# Patient Record
Sex: Female | Born: 1940 | Race: White | Hispanic: No | Marital: Married | Smoking: Never smoker
Health system: Southern US, Community
[De-identification: ages and names within clinical notes are randomized; demographics above are authoritative.]

## PROBLEM LIST (undated history)

## (undated) DIAGNOSIS — I2510 Atherosclerotic heart disease of native coronary artery without angina pectoris: Secondary | ICD-10-CM

## (undated) DIAGNOSIS — N289 Disorder of kidney and ureter, unspecified: Secondary | ICD-10-CM

## (undated) DIAGNOSIS — M549 Dorsalgia, unspecified: Secondary | ICD-10-CM

## (undated) DIAGNOSIS — E785 Hyperlipidemia, unspecified: Secondary | ICD-10-CM

## (undated) DIAGNOSIS — E669 Obesity, unspecified: Secondary | ICD-10-CM

## (undated) DIAGNOSIS — C20 Malignant neoplasm of rectum: Secondary | ICD-10-CM

## (undated) DIAGNOSIS — C50919 Malignant neoplasm of unspecified site of unspecified female breast: Secondary | ICD-10-CM

## (undated) DIAGNOSIS — E119 Type 2 diabetes mellitus without complications: Secondary | ICD-10-CM

## (undated) DIAGNOSIS — I82409 Acute embolism and thrombosis of unspecified deep veins of unspecified lower extremity: Secondary | ICD-10-CM

## (undated) DIAGNOSIS — I10 Essential (primary) hypertension: Secondary | ICD-10-CM

## (undated) DIAGNOSIS — I639 Cerebral infarction, unspecified: Secondary | ICD-10-CM

## (undated) DIAGNOSIS — E039 Hypothyroidism, unspecified: Secondary | ICD-10-CM

## (undated) HISTORY — DX: Malignant neoplasm of rectum: C20

## (undated) HISTORY — PX: ABDOMINAL HYSTERECTOMY: SHX81

## (undated) HISTORY — DX: Hyperlipidemia, unspecified: E78.5

## (undated) HISTORY — DX: Hypothyroidism, unspecified: E03.9

## (undated) HISTORY — DX: Obesity, unspecified: E66.9

## (undated) HISTORY — DX: Type 2 diabetes mellitus without complications: E11.9

## (undated) HISTORY — PX: GALLBLADDER SURGERY: SHX652

## (undated) HISTORY — DX: Essential (primary) hypertension: I10

## (undated) HISTORY — DX: Malignant neoplasm of unspecified site of unspecified female breast: C50.919

## (undated) HISTORY — DX: Dorsalgia, unspecified: M54.9

## (undated) HISTORY — DX: Acute embolism and thrombosis of unspecified deep veins of unspecified lower extremity: I82.409

## (undated) HISTORY — DX: Atherosclerotic heart disease of native coronary artery without angina pectoris: I25.10

## (undated) HISTORY — PX: ANKLE SURGERY: SHX546

## (undated) HISTORY — PX: APPENDECTOMY: SHX54

## (undated) HISTORY — DX: Disorder of kidney and ureter, unspecified: N28.9

## (undated) HISTORY — DX: Cerebral infarction, unspecified: I63.9

## (undated) HISTORY — PX: BREAST LUMPECTOMY: SHX2

---

## 2005-09-27 ENCOUNTER — Encounter: Payer: Self-pay | Admitting: Cardiology

## 2009-06-15 ENCOUNTER — Ambulatory Visit: Payer: Self-pay | Admitting: Family Medicine

## 2009-06-15 ENCOUNTER — Ambulatory Visit: Payer: Self-pay | Admitting: Hematology & Oncology

## 2009-06-15 DIAGNOSIS — I2510 Atherosclerotic heart disease of native coronary artery without angina pectoris: Secondary | ICD-10-CM | POA: Insufficient documentation

## 2009-06-15 DIAGNOSIS — I10 Essential (primary) hypertension: Secondary | ICD-10-CM

## 2009-06-15 DIAGNOSIS — E785 Hyperlipidemia, unspecified: Secondary | ICD-10-CM

## 2009-06-15 DIAGNOSIS — C20 Malignant neoplasm of rectum: Secondary | ICD-10-CM

## 2009-06-15 DIAGNOSIS — E669 Obesity, unspecified: Secondary | ICD-10-CM

## 2009-06-15 DIAGNOSIS — C50919 Malignant neoplasm of unspecified site of unspecified female breast: Secondary | ICD-10-CM | POA: Insufficient documentation

## 2009-07-04 ENCOUNTER — Ambulatory Visit: Payer: Self-pay | Admitting: Diagnostic Radiology

## 2009-07-04 ENCOUNTER — Ambulatory Visit (HOSPITAL_BASED_OUTPATIENT_CLINIC_OR_DEPARTMENT_OTHER): Admission: RE | Admit: 2009-07-04 | Discharge: 2009-07-04 | Payer: Self-pay | Admitting: Internal Medicine

## 2009-07-04 ENCOUNTER — Ambulatory Visit: Payer: Self-pay | Admitting: Family

## 2009-08-08 ENCOUNTER — Ambulatory Visit: Payer: Self-pay | Admitting: Emergency Medicine

## 2009-08-08 DIAGNOSIS — M79609 Pain in unspecified limb: Secondary | ICD-10-CM

## 2009-08-09 ENCOUNTER — Encounter: Admission: RE | Admit: 2009-08-09 | Discharge: 2009-08-09 | Payer: Self-pay | Admitting: Emergency Medicine

## 2009-08-09 ENCOUNTER — Encounter: Payer: Self-pay | Admitting: Emergency Medicine

## 2009-08-15 ENCOUNTER — Ambulatory Visit: Payer: Self-pay | Admitting: Family Medicine

## 2009-08-15 DIAGNOSIS — M170 Bilateral primary osteoarthritis of knee: Secondary | ICD-10-CM

## 2009-08-15 DIAGNOSIS — I803 Phlebitis and thrombophlebitis of lower extremities, unspecified: Secondary | ICD-10-CM

## 2009-08-15 LAB — CONVERTED CEMR LAB: INR: 3.7

## 2009-08-16 ENCOUNTER — Telehealth: Payer: Self-pay | Admitting: Family Medicine

## 2009-08-25 ENCOUNTER — Ambulatory Visit: Payer: Self-pay | Admitting: Family Medicine

## 2009-08-25 ENCOUNTER — Encounter: Admission: RE | Admit: 2009-08-25 | Discharge: 2009-08-25 | Payer: Self-pay | Admitting: Orthopedic Surgery

## 2009-08-25 LAB — CONVERTED CEMR LAB: INR: 2.2

## 2009-09-02 ENCOUNTER — Telehealth (INDEPENDENT_AMBULATORY_CARE_PROVIDER_SITE_OTHER): Payer: Self-pay | Admitting: *Deleted

## 2009-09-08 ENCOUNTER — Ambulatory Visit: Payer: Self-pay | Admitting: Family Medicine

## 2009-09-08 LAB — CONVERTED CEMR LAB: INR: 4.1

## 2009-09-15 ENCOUNTER — Ambulatory Visit: Payer: Self-pay | Admitting: Family Medicine

## 2009-09-15 LAB — HM DIABETES FOOT EXAM

## 2009-09-16 ENCOUNTER — Encounter: Payer: Self-pay | Admitting: Family Medicine

## 2009-09-16 LAB — CONVERTED CEMR LAB
BUN: 38 mg/dL — ABNORMAL HIGH (ref 6–23)
CO2: 25 meq/L (ref 19–32)
Calcium: 8.4 mg/dL (ref 8.4–10.5)
Chloride: 107 meq/L (ref 96–112)
Creatinine, Ser: 1.57 mg/dL — ABNORMAL HIGH (ref 0.40–1.20)
Glucose, Bld: 256 mg/dL — ABNORMAL HIGH (ref 70–99)
Potassium: 5 meq/L (ref 3.5–5.3)
Sodium: 141 meq/L (ref 135–145)
TSH: 5.583 microintl units/mL — ABNORMAL HIGH (ref 0.350–4.500)

## 2009-09-20 ENCOUNTER — Ambulatory Visit: Payer: Self-pay | Admitting: Hematology & Oncology

## 2009-09-21 ENCOUNTER — Encounter: Admission: RE | Admit: 2009-09-21 | Discharge: 2009-09-21 | Payer: Self-pay | Admitting: Family Medicine

## 2009-09-21 LAB — HM MAMMOGRAPHY

## 2009-09-29 ENCOUNTER — Ambulatory Visit: Payer: Self-pay | Admitting: Family Medicine

## 2009-09-29 LAB — CONVERTED CEMR LAB: INR: 1

## 2009-10-12 ENCOUNTER — Encounter: Payer: Self-pay | Admitting: Cardiology

## 2009-10-12 ENCOUNTER — Ambulatory Visit: Payer: Self-pay | Admitting: Cardiology

## 2009-10-12 ENCOUNTER — Inpatient Hospital Stay (HOSPITAL_COMMUNITY): Admission: EM | Admit: 2009-10-12 | Discharge: 2009-10-25 | Payer: Self-pay | Admitting: Cardiology

## 2009-10-13 ENCOUNTER — Telehealth (INDEPENDENT_AMBULATORY_CARE_PROVIDER_SITE_OTHER): Payer: Self-pay | Admitting: *Deleted

## 2009-10-14 ENCOUNTER — Encounter: Payer: Self-pay | Admitting: Cardiology

## 2009-10-20 ENCOUNTER — Encounter: Payer: Self-pay | Admitting: Cardiology

## 2009-10-21 ENCOUNTER — Encounter: Payer: Self-pay | Admitting: Cardiology

## 2009-11-04 ENCOUNTER — Ambulatory Visit: Payer: Self-pay | Admitting: Family Medicine

## 2009-11-04 LAB — CONVERTED CEMR LAB: INR: 2.1

## 2009-11-07 ENCOUNTER — Ambulatory Visit: Payer: Self-pay | Admitting: Family Medicine

## 2009-11-08 LAB — CONVERTED CEMR LAB
BUN: 27 mg/dL — ABNORMAL HIGH (ref 6–23)
CO2: 18 meq/L — ABNORMAL LOW (ref 19–32)
Calcium: 8.9 mg/dL (ref 8.4–10.5)
Chloride: 98 meq/L (ref 96–112)
Creatinine, Ser: 1.62 mg/dL — ABNORMAL HIGH (ref 0.40–1.20)
Glucose, Bld: 583 mg/dL (ref 70–99)
Hgb A1c MFr Bld: 9.5 % — ABNORMAL HIGH (ref <5.7)
Potassium: 5.5 meq/L — ABNORMAL HIGH (ref 3.5–5.3)
Sodium: 131 meq/L — ABNORMAL LOW (ref 135–145)

## 2009-11-09 ENCOUNTER — Encounter: Payer: Self-pay | Admitting: Cardiology

## 2009-11-09 ENCOUNTER — Ambulatory Visit: Payer: Self-pay | Admitting: Cardiology

## 2009-11-10 ENCOUNTER — Telehealth: Payer: Self-pay | Admitting: Family Medicine

## 2009-11-10 ENCOUNTER — Encounter: Payer: Self-pay | Admitting: Cardiology

## 2009-11-10 ENCOUNTER — Encounter: Payer: Self-pay | Admitting: Family Medicine

## 2009-11-22 ENCOUNTER — Ambulatory Visit: Payer: Self-pay | Admitting: Family Medicine

## 2009-11-22 LAB — CONVERTED CEMR LAB: INR: 1.2

## 2009-12-06 ENCOUNTER — Ambulatory Visit: Payer: Self-pay | Admitting: Family Medicine

## 2009-12-06 LAB — CONVERTED CEMR LAB: INR: 6.1

## 2009-12-12 ENCOUNTER — Ambulatory Visit: Payer: Self-pay | Admitting: Family Medicine

## 2009-12-12 ENCOUNTER — Telehealth: Payer: Self-pay | Admitting: Family Medicine

## 2009-12-12 LAB — CONVERTED CEMR LAB: INR: 3.5

## 2009-12-16 ENCOUNTER — Encounter: Payer: Self-pay | Admitting: Family Medicine

## 2009-12-20 ENCOUNTER — Ambulatory Visit: Payer: Self-pay | Admitting: Family Medicine

## 2009-12-20 LAB — CONVERTED CEMR LAB: INR: 1.3

## 2010-01-03 ENCOUNTER — Ambulatory Visit: Payer: Self-pay | Admitting: Family Medicine

## 2010-01-03 LAB — CONVERTED CEMR LAB: INR: 3.7

## 2010-01-19 ENCOUNTER — Ambulatory Visit: Admit: 2010-01-19 | Payer: Self-pay | Admitting: Family Medicine

## 2010-01-25 ENCOUNTER — Ambulatory Visit
Admission: RE | Admit: 2010-01-25 | Discharge: 2010-01-25 | Payer: Self-pay | Source: Home / Self Care | Attending: Cardiology | Admitting: Cardiology

## 2010-01-25 ENCOUNTER — Encounter: Payer: Self-pay | Admitting: Cardiology

## 2010-02-14 NOTE — Letter (Signed)
Summary: [facility]   Imported By: Lanelle Bal 09/29/2009 11:57:51  _____________________________________________________________________  External Attachment:    Type:   Image     Comment:   External Document

## 2010-02-14 NOTE — Assessment & Plan Note (Signed)
Summary: Coumadin check - jr  Nurse Visit   Vitals Entered By: Payton Spark CMA (September 29, 2009 10:15 AM)  Allergies: No Known Drug Allergies Laboratory Results   Blood Tests      INR: 1.0   (Normal Range: 0.88-1.12   Therap INR: 2.0-3.5)    Orders Added: 1)  Fingerstick [36416] 2)  Protime [16109UE]   Anticoagulation Management History:      The patient is on coumadin and comes in today for a routine follow up visit.  She is being anticoagulated because of the first episode of deep venous thrombosis and/or pulmonary embolism.  Anticipated length of treatment is 6 months.  Her last INR was 4.1 and today's INR is 1.0.    Anticoagulation Management Assessment/Plan:      The target INR is 2.0-3.0.  She is to have a PT/INR in 2 weeks.  Anticoagulation instructions were given to patient.         Current Anticoagulation Instructions: The patient's dosage of coumadin will be increased.  The new dosage includes:   Coumadin 5 mg tabs:  Sunday - 5 mg, Monday - 5 mg, Tuesday - 2.5 mg, Wednesday - 5 mg, Thursday - 2.5 mg, Friday - 5 mg, Saturday - 5 mg.    Repeat PT/INR in 2 weeks.

## 2010-02-14 NOTE — Assessment & Plan Note (Signed)
Summary: BAD COLD//VGJ   Vital Signs:  Patient profile:   70 year old female Weight:      193.75 pounds BMI:     35.11 O2 Sat:      97 % on Room air Temp:     98.4 degrees F oral Pulse rate:   72 / minute Pulse rhythm:   regular Resp:     18 per minute BP sitting:   150 / 90  (right arm) Cuff size:   large  Vitals Entered By: Glendell Docker CMA (July 04, 2009 1:55 PM)  O2 Flow:  Room air CC: Rm 5 -URI Comments onset 2 days ago productive cough clear in color, nasal drainage, left earache onset this am, did not check temp at home but felt very warm last night, chest congestion, Humalog per patient on sliding scale, medications reviewed   Primary Care Provider:  Seymour Bars DO  CC:  Rm 5 -URI.  History of Present Illness: [name] is a 70 year old female who presents today with complaint of cough and chest congestion which started two days ago. Cough has been severe at night and patient has found it difficulty to sleep due to excessive coughing. Notes poor energy, and L ear pain.  She has used tylenol without any improvment in her symptoms. She is accompanied today by her daughter.  Allergies: No Known Drug Allergies  Past History:  Past Medical History: Last updated: 07/01/09 rectal cancer- radiation, chemo, surgery 2001 AMI x 3 cardiac stents x 5 HTN DM  High cholesterol hypothyroidism obesity R breast cancer 09  Past Surgical History: Last updated: 2009-07-01 colostomy, revision 04-2009 gall bladder appendix Hysterectomy R lumpectomy- breast cancer, 2009  Family History: Last updated: Jul 01, 2009 M died CHF, 47 F died CHF, COPD at 69 brother, sister DM, CAD  Social History: Last updated: 2009-07-01 Retried from medical billing. Finished HS. Married to [place].  Has 3 kids. Never smoked. Rare ETOH. No regular exericise.  Risk Factors: Alcohol Use: <1 (07/01/2009) Exercise: no (07-01-2009)  Risk Factors: Smoking Status: never  (2009-07-01)  Physical Exam  General:  Well-developed,well-nourished,in no acute distress; alert,appropriate and cooperative throughout examination Head:  Normocephalic and atraumatic without obvious abnormalities. No apparent alopecia or balding. Lungs:  L mid lung crackles, + soft expiratory wheeze, no increased WOB Heart:  Normal rate and regular rhythm. S1 and S2 normal without gallop, murmur, click, rub or other extra sounds. Psych:  Cognition and judgment appear intact. Alert and cooperative with normal attention span and concentration. No apparent delusions, illusions, hallucinations   Impression & Recommendations:  Problem # 1:  BRONCHITIS (ICD-490) Assessment New Reviewed CXR with radiologist.  Neg for pneumonia, + bronchitis.  Clinically suspect some reactive airway as well.  Will avoid PO steroids as patient is diabetic and insulin dependent.  Wheezing is mild at this point.  Will plan for short term flovent/proair and will rx with Zithromax for bronchitis.   Add Tussionex to be given at bedtime as needed for cough.  Pt instructed to follow up in 1 week, sooner if symptoms worsen or do not improve.  Orders: CXR- 2view (CXR) Prescription Created Electronically [id number])  Her updated medication list for this problem includes:    Zithromax Z-pak 250 Mg Tabs (Azithromycin) .Marland Kitchen... 2 tabs by mouth today, then one tablet by mouth daily x 4 more days    Proair Hfa 108 (90 Base) Mcg/act Aers (Albuterol sulfate) .Marland Kitchen... 2 puffs every 6 hours as needed for cough, wheezing  Flovent Diskus 250 Mcg/blist Aepb (Fluticasone propionate (inhal)) ..... One puff twice daily for next 2 weeks    Tussionex Pennkinetic Er 8-10 Mg/34ml Lqcr (Chlorpheniramine-hydrocodone) ..... One teaspoon every 12 hours as needed for severe cough  Complete Medication List: 1)  Prevacid 30 Mg Cpdr (Lansoprazole) .... Take one tabelt by mouth before breakfast 2)  Coreg 12.5 Mg Tabs (Carvedilol) .... Take one tablet by mouth  twice a day with food 3)  Clonidine Hcl 0.3 Mg Tabs (Clonidine hcl) .... Take one tablet by mouth three times a day 4)  Aromasin 25 Mg Tabs (Exemestane) .... Take one tablet by mouth oncea d ay 5)  Zoloft 100 Mg Tabs (Sertraline hcl) .... Take one tablet by mouth once a day 6)  Hydrochlorothiazide 25 Mg Tabs (Hydrochlorothiazide) .... Take one tabelt by mouth once a day 7)  Norvasc 10 Mg Tabs (Amlodipine besylate) .... Take one tablet by mouth once a day 8)  Aspirin 81 Mg Tbec (Aspirin) .... Take one tablet by mouth twice ad ay 9)  Zocor 40 Mg Tabs (Simvastatin) .... Take one tablet by mouth once a day 10)  Lantus For Opticlik 100 Unit/ml Soln (Insulin glargine) .... Inject 25 units in the evening 11)  Vitamin C 500 Mg Chew (Ascorbic acid) .... Take one tablet by mouth once a day 12)  Zithromax Z-pak 250 Mg Tabs (Azithromycin) .... 2 tabs by mouth today, then one tablet by mouth daily x 4 more days 13)  Proair Hfa 108 (90 Base) Mcg/act Aers (Albuterol sulfate) .... 2 puffs every 6 hours as needed for cough, wheezing 14)  Flovent Diskus 250 Mcg/blist Aepb (Fluticasone propionate (inhal)) .... One puff twice daily for next 2 weeks 15)  Tussionex Pennkinetic Er 8-10 Mg/51ml Lqcr (Chlorpheniramine-hydrocodone) .... One teaspoon every 12 hours as needed for severe cough 16)  Humalog 100 Unit/ml Soln (Insulin lispro (human)) .... Sliding scale coverage  Patient Instructions: 1)  Call if symptoms worsen or do not improve. 2)  Follow up in 1 week. Prescriptions: Sandria Senter ER 8-10 MG/5ML LQCR (CHLORPHENIRAMINE-HYDROCODONE) one teaspoon every 12 hours as needed for severe cough  #120 x 0   Entered and Authorized by:   Lemont Fillers FNP   Signed by:   Lemont Fillers FNP on 07/04/2009   Method used:   Print then Give to Patient   RxID:   [id number] PROAIR HFA 108 (90 BASE) MCG/ACT AERS (ALBUTEROL SULFATE) 2 puffs every 6 hours as needed for cough, wheezing  #1 x 0    Entered and Authorized by:   Lemont Fillers FNP   Signed by:   Lemont Fillers FNP on 07/04/2009   Method used:   Electronically to        [facility] [phone]* (retail)       [address] [place], [place]  [address]       Ph: [phone]       Fax: [phone]   RxID:   [id number] ZITHROMAX Z-PAK 250 MG TABS (AZITHROMYCIN) 2 tabs by mouth today, then one tablet by mouth daily x 4 more days  #1 pack x 0   Entered and Authorized by:   Lemont Fillers FNP   Signed by:   Lemont Fillers FNP on 07/04/2009   Method used:   Electronically to        [facility] [phone]* (retail)       [address]  [address] [place]  [address]       Ph: [phone]       Fax: [phone]   RxID:   [id number]

## 2010-02-14 NOTE — Consult Note (Signed)
Summary: [facility]   [facility]   Imported By: Roderic Ovens 11/01/2009 14:45:38  _____________________________________________________________________  External Attachment:    Type:   Image     Comment:   External Document

## 2010-02-14 NOTE — Progress Notes (Signed)
  ROI faxed over to [facility] @ [phone]. [name]  October 13, 2009 1:38 PM      Appended Document:  Records recieved from [facility] gave to [facility]

## 2010-02-14 NOTE — Letter (Signed)
Summary: [name]   [name]   Imported By: Roderic Ovens 11/02/2009 16:27:48  _____________________________________________________________________  External Attachment:    Type:   Image     Comment:   External Document

## 2010-02-14 NOTE — Consult Note (Signed)
Summary: [facility]  [facility]   Imported By: Maryln Gottron 12/23/2009 11:31:29  _____________________________________________________________________  External Attachment:    Type:   Image     Comment:   External Document

## 2010-02-14 NOTE — Assessment & Plan Note (Signed)
Summary: thrombophlebitis   Vital Signs:  Patient profile:   70 year old female Height:      62.4 inches Weight:      196 pounds BMI:     35.52 O2 Sat:      99 % on Room air Pulse rate:   51 / minute BP sitting:   160 / 88  Vitals Entered By: Payton Spark CMA (August 15, 2009 2:04 PM)  O2 Flow:  Room air CC: F/U UC and INR check   Primary Care Provider:  Seymour Bars DO  CC:  F/U UC and INR check.  History of Present Illness: 70 yo WF presents for a superficial thrombophlebitis.  She reports a hx of post op DVT years ago.  This time, she had just returned from a long car ride to [place] when she had pain on the medial and posterior side of her L knee.  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  This pain seemed different.  She went to [facility] and had a LLE venous doppler u/s on 08-09-09 showing no DVT but + fairly extensive superficial thrombophlebitis in the greater saphenous vein (from the mid thigh down to the ankle).  She remains tender.  Able to walk w/o much problem.  Started coumadin 1 wk ago.  Denies any bleeding problems.  Her husband is on coumadin, so she is aware of dietary changes and risks.  Her hx is complicated by a hx of breast and colon cancer.  She is due for her mammogram and is set up to see an oncologist here in Sept after moving here recently from [place].  Her most recent surgery was a colostomy revision in April 2011.    Anticoagulation Management History:      The patient is on coumadin and comes in today for a routine follow up visit.  She is being anticoagulated due to the first episode of deep venous thrombosis and/or pulmonary embolism.  Anticipated length of treatment is 6 months.  Today's INR is 3.7.     Current Medications (verified): 1)  Prevacid 30 Mg Cpdr (Lansoprazole) .... Take One Tabelt By Mouth Before Breakfast 2)  Coreg 12.5 Mg Tabs (Carvedilol) .... Take One Tablet By Mouth Twice A Day With Food 3)  Clonidine Hcl 0.3 Mg  Tabs (Clonidine Hcl) .... Take One Tablet By Mouth Three Times A Day 4)  Aromasin 25 Mg Tabs (Exemestane) .... Take One Tablet By Mouth Oncea D Ay 5)  Zoloft 100 Mg Tabs (Sertraline Hcl) .... Take One Tablet By Mouth Once A Day 6)  Hydrochlorothiazide 25 Mg Tabs (Hydrochlorothiazide) .... Take One Tabelt By Mouth Once A Day 7)  Norvasc 10 Mg Tabs (Amlodipine Besylate) .... Take One Tablet By Mouth Once A Day 8)  Aspirin 81 Mg Tbec (Aspirin) .... Take One Tablet By Mouth Twice Ad Ay 9)  Zocor 40 Mg Tabs (Simvastatin) .... Take One Tablet By Mouth Once A Day 10)  Lantus For Opticlik 100 Unit/ml Soln (Insulin Glargine) .... Inject 25 Units in The Evening 11)  Vitamin C 500 Mg Chew (Ascorbic Acid) .... Take One Tablet By Mouth Once A Day 12)  Proair Hfa 108 (90 Base) Mcg/act Aers (Albuterol Sulfate) .... 2 Puffs Every 6 Hours As Needed For Cough, Wheezing 13)  Flovent Diskus 250 Mcg/blist Aepb (Fluticasone Propionate (Inhal)) .... One Puff Twice Daily For Next 2 Weeks 14)  Humalog 100 Unit/ml Soln (Insulin Lispro (Human)) .Marland KitchenMarland KitchenMarland Kitchen  Sliding Scale Coverage 15)  Freestyle Light Test Strips .... Use Twice Daily As Directed 16)  Warfarin Sodium 5 Mg Tabs (Warfarin Sodium) .Marland Kitchen.. 1 Tab By Mouth Daily  Allergies (verified): No Known Drug Allergies  Past History:  Past Medical History: Reviewed history from 06/15/2009 and no changes required. rectal cancer- radiation, chemo, surgery 2001 AMI x 3 cardiac stents x 5 HTN DM  High cholesterol hypothyroidism obesity R breast cancer 09  Past Surgical History: Reviewed history from 06/15/2009 and no changes required. colostomy, revision 04-2009 gall bladder appendix Hysterectomy R lumpectomy- breast cancer, 2009  Family History: Reviewed history from 06/15/2009 and no changes required. M died CHF, 62 F died CHF, COPD at 50 brother, sister DM, CAD  Social History: Reviewed history from 06/15/2009 and no changes required. Retried from medical  billing. Finished HS. Married to [place].  Has 3 kids. Never smoked. Rare ETOH. No regular exericise.  Review of Systems      See HPI  Physical Exam  General:  obese WF in NAD, here with husband Head:  normocephalic and atraumatic.   Mouth:  pharynx pink and moist.   Neck:  no masses.   Lungs:  normal respiratory effort, no intercostal retractions, and no accessory muscle use.   Heart:  Normal rate and regular rhythm. S1 and S2 normal without gallop, murmur, click, rub or other extra sounds. Msk:  bilat knee crepitus.  no joint effusions, normal gait Pulses:  2+ pedal pulses Extremities:  L posterior and medial knee very tender to touch.  L posterior thigh and L calf tender to touch with palpable cord.  No redness or heat Skin:  color normal.   Cervical Nodes:  No lymphadenopathy noted Psych:  good eye contact, not anxious appearing, and not depressed appearing.     Impression & Recommendations:  Problem # 1:  PHLEBITIS&THROMBOPHLEBITIS LOWER EXTREM UNSPEC (ICD-451.2) Assessment New Reviewed the results of her u/s from 7-26.  This greater saphenoous thrombophlebitis has the potential to extend into the deep veins, so will be treated like a DVT.  Continue coumadin.  Start graduated compression hose for comfort and swelling.  This was likely caused by her recent long car trip but she has hx of a post op DVT in the past and has had both colon and breast cancer.  I've set her up with oncology and printed off an order to update her mammogram.  Colonsocpy was recently doen prior to her colon resection per per report.   The following medications were removed from the medication list:    Warfarin Sodium 5 Mg Tabs (Warfarin sodium) .Marland Kitchen... 1 tab by mouth daily Her updated medication list for this problem includes:    Aspirin 81 Mg Tbec (Aspirin) .Marland Kitchen... Take one tablet by mouth twice ad ay    Coumadin 5 Mg Tabs (Warfarin sodium) ..... Sunday - 5 mg, monday - 2.5 mg, tuesday - 5 mg, wednesday - 2.5  mg, thursday - 5 mg, friday - 2.5 mg, saturday - 2.5 mg  Orders: Fingerstick (16109) Protime INR (60454)  Problem # 2:  COUMADIN THERAPY (ICD-V58.61) Adjusted her coumadin DOWN today.  REcheck in 2 wks.  Problem # 3:  ESSENTIAL HYPERTENSION, BENIGN (ICD-401.1)  BP remains high despite multiple anti hypertensives.  Not sure why she is not on an ACEi or ARB.  She does not recall any allergies to these medications.  Will update a BMP and if Cr is normal, will start.  O/W given her hx of CAD  and HTN--> refer to cards. Her updated medication list for this problem includes:    Coreg 12.5 Mg Tabs (Carvedilol) .Marland Kitchen... Take one tablet by mouth twice a day with food    Clonidine Hcl 0.3 Mg Tabs (Clonidine hcl) .Marland Kitchen... Take one tablet by mouth three times a day    Hydrochlorothiazide 25 Mg Tabs (Hydrochlorothiazide) .Marland Kitchen... Take one tabelt by mouth once a day    Norvasc 10 Mg Tabs (Amlodipine besylate) .Marland Kitchen... Take one tablet by mouth once a day  BP today: 160/88 Prior BP: 162/70 (08/08/2009)  Orders: T-Basic Metabolic Panel [phone])  Problem # 4:  OSTEOARTHRITIS, KNEE, LEFT (ICD-715.96) Ongoing problem.  Had synvisc injections in [place] and reports that they did not help. She is set up to see ortho.  Maybe a steroid injection will help alleviate her pain.  Avoid NSAIDs with use of coumadin.  Wt loss would definitely be benefitical. Her updated medication list for this problem includes:    Aspirin 81 Mg Tbec (Aspirin) .Marland Kitchen... Take one tablet by mouth twice ad ay  Complete Medication List: 1)  Prevacid 30 Mg Cpdr (Lansoprazole) .... Take one tabelt by mouth before breakfast 2)  Coreg 12.5 Mg Tabs (Carvedilol) .... Take one tablet by mouth twice a day with food 3)  Clonidine Hcl 0.3 Mg Tabs (Clonidine hcl) .... Take one tablet by mouth three times a day 4)  Aromasin 25 Mg Tabs (Exemestane) .... Take one tablet by mouth oncea d ay 5)  Zoloft 100 Mg Tabs (Sertraline hcl) .... Take one tablet by mouth once  a day 6)  Hydrochlorothiazide 25 Mg Tabs (Hydrochlorothiazide) .... Take one tabelt by mouth once a day 7)  Norvasc 10 Mg Tabs (Amlodipine besylate) .... Take one tablet by mouth once a day 8)  Aspirin 81 Mg Tbec (Aspirin) .... Take one tablet by mouth twice ad ay 9)  Zocor 40 Mg Tabs (Simvastatin) .... Take one tablet by mouth once a day 10)  Lantus For Opticlik 100 Unit/ml Soln (Insulin glargine) .... Inject 25 units in the evening 11)  Vitamin C 500 Mg Chew (Ascorbic acid) .... Take one tablet by mouth once a day 12)  Proair Hfa 108 (90 Base) Mcg/act Aers (Albuterol sulfate) .... 2 puffs every 6 hours as needed for cough, wheezing 13)  Flovent Diskus 250 Mcg/blist Aepb (Fluticasone propionate (inhal)) .... One puff twice daily for next 2 weeks 14)  Humalog 100 Unit/ml Soln (Insulin lispro (human)) .... Sliding scale coverage 15)  Freestyle Light Test Strips  .... Use twice daily as directed 16)  Coumadin 5 Mg Tabs (Warfarin sodium) .... Sunday - 5 mg, monday - 2.5 mg, tuesday - 5 mg, wednesday - 2.5 mg, thursday - 5 mg, friday - 2.5 mg, saturday - 2.5 mg 17)  Graduated Compression Pantyhose  .... Dx: thrombophlebitis les  Other Orders: T-Mammography, Diagnostic (bilateral) (84132)  Anticoagulation Management Assessment/Plan:      The target INR is 2.0-3.0.  She is to have a PT/INR in 1 week.  Anticoagulation instructions were given to patient.         Current Anticoagulation Instructions: Hold dose Today.  The patient's dosage of coumadin will be decreased.  The new dosage includes: Coumadin 5 mg tabs:  Sunday - 5 mg, Monday - 2.5 mg, Tuesday - 5 mg, Wednesday - 2.5 mg, Thursday - 5 mg, Friday - 2.5 mg, Saturday - 2.5 mg.   Repeat PT/INR in 1 week.    Patient Instructions: 1)  See changes  to coumadin dose. 2)  Use Tylenol Extra Strength for pain. 3)  Wear graduated compression hose and elevate legs. 4)  Warm moist heat may help your pain. 5)  Recheck INR in 10 days. 6)  Will get  you into cardiology for BP -- high even on mutliple meds. 7)  You should be seeing oncology next month. 8)  Will update your mammogram. Prescriptions: GRADUATED COMPRESSION PANTYHOSE dx: thrombophlebitis LEs  #2 pair x 0   Entered and Authorized by:   Seymour Bars DO   Signed by:   Seymour Bars DO on 08/15/2009   Method used:   Printed then faxed to ...       [address] [phone]* (retail)       [address] [place], [place]  [address]       Ph: [phone]       Fax: [phone]   RxID:   [id number]   Laboratory Results   Blood Tests      INR: 3.7   (Normal Range: 0.88-1.12   Therap INR: 2.0-3.5)

## 2010-02-14 NOTE — Progress Notes (Signed)
Summary: Faxed lab results to [facility]  Phone Note From Other Clinic   Caller: Nurse Summary of Call: Faxed 11/04/09 Blood test results to [facility] [phone] attn: Diane Initial call taken by: Lannette Donath,  November 10, 2009 3:34 PM

## 2010-02-14 NOTE — Assessment & Plan Note (Signed)
Summary: 1 mo. f/u on Diabetes- jr   Vital Signs:  Patient profile:   70 year old female Height:      62.4 inches Weight:      196 pounds BMI:     35.52 O2 Sat:      96 % on Room air Pulse rate:   55 / minute BP sitting:   160 / 78  (left arm) Cuff size:   large  Vitals Entered By: Payton Spark CMA (September 15, 2009 10:24 AM)  O2 Flow:  Room air CC: F/U INR and DM   Primary Care Provider:  Seymour Bars DO  CC:  F/U INR and DM.  History of Present Illness: [name] is a 70 year-old female with a history of DM, HTN, hyperlipidemia and DVT.  Today she reports she is doing well and that she and her husband are getting ready to move into a house.  In the morning her blood glucose readings are usually around 150 to 160 and her readings in the evenings are around 200 or higher.  She claims this because she is not on a regular eating schedule and she is eating out at restuarants because most of her time is spent packing and preparing for their move.  In addition, she also skipping meals and having readings that are in the 80s to 90s and becomes lightheaded and has nausea.  She reports severe nocturia last night with getting up 5 or 6 times last night to go to the bathroom but denies any polydipsia or polyuria during the day.  She also denies any hematuria, burning sensations while going, straining to go or dysuria.  In addition, she is not expericing palpitations, abdominal pain, shortness of breath any muscle aches and pains, vomitting or vision changes.    Anticoagulation Management History:      The patient is on coumadin and comes in today for a routine follow up visit.  She is being anticoagulated due to the first episode of deep venous thrombosis and/or pulmonary embolism.  Anticipated length of treatment is 6 months.  Her last INR was 4.1.    Current Medications (verified): 1)  Prevacid 30 Mg Cpdr (Lansoprazole) .... Take One Tabelt By Mouth Before Breakfast 2)  Coreg 12.5 Mg  Tabs (Carvedilol) .... Take One Tablet By Mouth Twice A Day With Food 3)  Clonidine Hcl 0.3 Mg Tabs (Clonidine Hcl) .... Take One Tablet By Mouth Three Times A Day 4)  Aromasin 25 Mg Tabs (Exemestane) .... Take One Tablet By Mouth Oncea D Ay 5)  Zoloft 100 Mg Tabs (Sertraline Hcl) .... Take One Tablet By Mouth Once A Day 6)  Hydrochlorothiazide 25 Mg Tabs (Hydrochlorothiazide) .... Take One Tabelt By Mouth Once A Day 7)  Norvasc 10 Mg Tabs (Amlodipine Besylate) .... Take One Tablet By Mouth Once A Day 8)  Aspirin 81 Mg Tbec (Aspirin) .... Take One Tablet By Mouth Twice Ad Ay 9)  Zocor 40 Mg Tabs (Simvastatin) .... Take One Tablet By Mouth Once A Day 10)  Lantus Solostar 100 Unit/ml Soln (Insulin Glargine) .... Inj 25 Units Every Evening 11)  Vitamin C 500 Mg Chew (Ascorbic Acid) .... Take One Tablet By Mouth Once A Day 12)  Freestyle Light Test Strips .... Use Twice Daily As Directed 13)  Humalog Pen 100 Unit/ml Soln (Insulin Lispro (Human)) .... Sliding Scale As Directed 14)  Coumadin 5 Mg Tabs (Warfarin Sodium) .... Sunday - 5 Mg, Monday - 2.5 Mg, Tuesday -  2.5 Mg, Wednesday - 5 Mg, Thursday - 2.5 Mg, Friday - 2.5 Mg, Saturday - 2.5 Mg  Allergies (verified): No Known Drug Allergies  Past History:  Past Medical History: rectal cancer- radiation, chemo, surgery 2001 AMI x 3 cardiac stents x 5 HTN DM  High cholesterol hypothyroidism obesity R breast cancer 09 DVT 08-2009  Past Surgical History: Reviewed history from 06/15/2009 and no changes required. colostomy, revision 04-2009 gall bladder appendix Hysterectomy R lumpectomy- breast cancer, 2009  Social History: Reviewed history from 06/15/2009 and no changes required. Retried from medical billing. Finished HS. Married to [place].  Has 3 kids. Never smoked. Rare ETOH. No regular exericise.  Review of Systems      See HPI  Physical Exam  General:  alert, well-developed, and well-nourished.  obese Eyes:  pupils equal,  pupils round, and pupils reactive to light.   Mouth:  good dentition.   Neck:  supple and no thyromegaly.  No bruits appreciated Lungs:  Clear to auscultation bilaterally with no wheezes, rales, rhonchi or crackles.  Heart:  normal rate, regular rhythm, no murmur, no gallop, and no rub.   Abdomen:  soft and non-tender.  Dimished bowel sounds Pulses:  R popliteal normal, R posterior tibial normal, R dorsalis pedis normal, L popliteal normal, L posterior tibial normal, and L dorsalis pedis normal.   Extremities:  1+ left pedal edema.   Skin:  color normal.   Psych:  good eye contact and not anxious appearing.    Diabetes Management Exam:    Foot Exam (with socks and/or shoes not present):       Sensory-Pinprick/Light touch:          Left medial foot (L-4): normal          Left dorsal foot (L-5): normal          Left lateral foot (S-1): normal          Right medial foot (L-4): normal          Right dorsal foot (L-5): normal          Right lateral foot (S-1): normal       Sensory-Monofilament:          Left foot: normal          Right foot: normal       Sensory-other: Decreased vibratory sense at the ankle but normal at the great toe       Inspection:          Left foot: normal          Right foot: normal   Impression & Recommendations:  Problem # 1:  PHLEBITIS&THROMBOPHLEBITIS LOWER EXTREM UNSPEC (ICD-451.2) She has not been regularly taking her coumadin due to 'being busy'.  I reminded her to take it every evening as directed since we are treating her for a DVT.  She is to stay on the current dose and recheck in 2 wks. The following medications were removed from the medication list:    Coumadin 5 Mg Tabs (Warfarin sodium) ..... Sunday - 5 mg, monday - 2.5 mg, tuesday - 5 mg, wednesday - 2.5 mg, thursday - 5 mg, friday - 2.5 mg, saturday - 2.5 mg    Coumadin 5 Mg Tabs (Warfarin sodium) ..... Sunday - 5 mg, monday - 2.5 mg, tuesday - 2.5 mg, wednesday - 5 mg, thursday - 2.5 mg, friday  - 2.5 mg, saturday - 2.5 mg Her updated medication list for this problem includes:  Aspirin 81 Mg Tbec (Aspirin) .Marland Kitchen... Take one tablet by mouth twice ad ay    Coumadin 5 Mg Tabs (Warfarin sodium) ..... Sunday - 5 mg, monday - 2.5 mg, tuesday - 2.5 mg, wednesday - 5 mg, thursday - 2.5 mg, friday - 2.5 mg, saturday - 2.5 mg  Orders: Protime ([id number])  Problem # 2:  IDDM (ICD-250.01) Continue current meds.  Poor control with A1C of 8 likely due to med non adherence, poor diet and lack of exercise.  Check labs and if renal function OK, add Kombiglyze. Her updated medication list for this problem includes:    Aspirin 81 Mg Tbec (Aspirin) .Marland Kitchen... Take one tablet by mouth twice ad ay    Lantus Solostar 100 Unit/ml Soln (Insulin glargine) ..... Inj 25 units every evening    Humalog Pen 100 Unit/ml Soln (Insulin lispro (human)) ..... Sliding scale as directed  Orders: Fingerstick (36416) Hgb A1C ([id number]) T-Basic Metabolic Panel ([id number])  Problem # 3:  ESSENTIAL HYPERTENSION, BENIGN (ICD-401.1) BP still high.  Check BMP today and if Cr and K+ are OK, will add ACEi. Her updated medication list for this problem includes:    Coreg 12.5 Mg Tabs (Carvedilol) .Marland Kitchen... Take one tablet by mouth twice a day with food    Clonidine Hcl 0.3 Mg Tabs (Clonidine hcl) .Marland Kitchen... Take one tablet by mouth three times a day    Hydrochlorothiazide 25 Mg Tabs (Hydrochlorothiazide) .Marland Kitchen... Take one tabelt by mouth once a day    Norvasc 10 Mg Tabs (Amlodipine besylate) .Marland Kitchen... Take one tablet by mouth once a day  BP today: 160/78 Prior BP: 160/88 (08/15/2009)  Problem # 4:  HYPERLIPIDEMIA (ICD-272.4)  Her updated medication list for this problem includes:    Zocor 40 Mg Tabs (Simvastatin) .Marland Kitchen... Take one tablet by mouth once a day  Complete Medication List: 1)  Prevacid 30 Mg Cpdr (Lansoprazole) .... Take one tabelt by mouth before breakfast 2)  Coreg 12.5 Mg Tabs (Carvedilol) .... Take one tablet by mouth twice a day  with food 3)  Clonidine Hcl 0.3 Mg Tabs (Clonidine hcl) .... Take one tablet by mouth three times a day 4)  Aromasin 25 Mg Tabs (Exemestane) .... Take one tablet by mouth oncea d ay 5)  Zoloft 100 Mg Tabs (Sertraline hcl) .... Take one tablet by mouth once a day 6)  Hydrochlorothiazide 25 Mg Tabs (Hydrochlorothiazide) .... Take one tabelt by mouth once a day 7)  Norvasc 10 Mg Tabs (Amlodipine besylate) .... Take one tablet by mouth once a day 8)  Aspirin 81 Mg Tbec (Aspirin) .... Take one tablet by mouth twice ad ay 9)  Zocor 40 Mg Tabs (Simvastatin) .... Take one tablet by mouth once a day 10)  Lantus Solostar 100 Unit/ml Soln (Insulin glargine) .... Inj 25 units every evening 11)  Vitamin C 500 Mg Chew (Ascorbic acid) .... Take one tablet by mouth once a day 12)  Freestyle Light Test Strips  .... Use twice daily as directed 13)  Humalog Pen 100 Unit/ml Soln (Insulin lispro (human)) .... Sliding scale as directed 14)  Coumadin 5 Mg Tabs (Warfarin sodium) .... Sunday - 5 mg, monday - 2.5 mg, tuesday - 2.5 mg, wednesday - 5 mg, thursday - 2.5 mg, friday - 2.5 mg, saturday - 2.5 mg  Other Orders: T-TSH ([id number])  Anticoagulation Management Assessment/Plan:      The target INR is 2.0-3.0.  She is to have a PT/INR in 2 weeks.  Anticoagulation instructions were given to  patient.         Current Anticoagulation Instructions:   Coumadin 5 mg tabs:  Sunday - 5 mg, Monday - 2.5 mg, Tuesday - 2.5 mg, Wednesday - 5 mg, Thursday - 2.5 mg, Friday - 2.5 mg, Saturday - 2.5 mg.    The patient is to continue with the same dose of coumadin.  This dosage includes: Repeat PT/INR in 2 weeks.    Patient Instructions: 1)  Labs today. 2)  Will call you w/ results tomorrow- may add a new agent for BP and diabetes. 3)  Work on medication compliance, diabetic diet and exercise. 4)  AM fasting sugar goal is 80-110 5)  2 hrs after dinner goal is <150. 6)  Recheck INR 2 wks. 7)  Return for f/u BP/ DM in 6  wks.

## 2010-02-14 NOTE — Assessment & Plan Note (Signed)
Summary: Coumadin Check - jr  Nurse Visit   Vitals Entered By: Payton Spark CMA (December 06, 2009 1:09 PM)  Allergies: No Known Drug Allergies Laboratory Results   Blood Tests      INR: 6.1   (Normal Range: 0.88-1.12   Therap INR: 2.0-3.5)    Orders Added: 1)  Fingerstick [36416] 2)  Protime [[id number]]   Anticoagulation Management History:      The patient is on coumadin and comes in today for a routine follow up visit.  Coumadin therapy is being given due to the first episode of deep venous thrombosis and/or pulmonary embolism.  Anticipated length of treatment is 6 months.  Her last INR was 1.2 and today's INR is 6.1.    Anticoagulation Management Assessment/Plan:      The target INR is 2.0-3.0.  She is to have a PT/INR in 2 weeks.  Anticoagulation instructions were given to patient.         Current Anticoagulation Instructions: The patient's dosage of coumadin will be decreased.  The new dosage includes: Coumadin 5 mg tabs:  Sunday - 5 mg, Monday - 2.5 mg, Tuesday - 5 mg, Wednesday - 2.5 mg, Thursday - 5 mg, Friday - 2.5 mg, Saturday - 5 mg.    recheck 10 daysRepeat PT/INR in 2 weeks.

## 2010-02-14 NOTE — Letter (Signed)
Summary: [facility] Patient Note   [facility] Patient Note   Imported By: Roderic Ovens 12/01/2009 16:57:01  _____________________________________________________________________  External Attachment:    Type:   Image     Comment:   External Document

## 2010-02-14 NOTE — Assessment & Plan Note (Signed)
Summary: PAIN IN LEFT LEG & KNEE/KH   Vital Signs:  Patient Profile:   70 Years Old Female CC:      sharp pain in left leg/knee X 3 days Height:     62.4 inches Weight:      200 pounds O2 Sat:      99 % O2 treatment:    Room Air Temp:     97.0 degrees F oral Pulse rate:   56 / minute Resp:     12 per minute BP sitting:   162 / 70  (left arm) Cuff size:   large  Pt. in pain?   yes    Location:   left leg/knee    Intensity:   8    Type:       sharp  Vitals Entered By: Lajean Saver RN (August 08, 2009 3:47 PM)                   Updated Prior Medication List: PREVACID 30 MG CPDR (LANSOPRAZOLE) Take one tabelt by mouth before breakfast COREG 12.5 MG TABS (CARVEDILOL) Take one tablet by mouth twice a day with food CLONIDINE HCL 0.3 MG TABS (CLONIDINE HCL) take one tablet by mouth three times a day AROMASIN 25 MG TABS (EXEMESTANE) take one tablet by mouth oncea d ay ZOLOFT 100 MG TABS (SERTRALINE HCL) Take one tablet by mouth once a day HYDROCHLOROTHIAZIDE 25 MG TABS (HYDROCHLOROTHIAZIDE) Take one tabelt by mouth once a day NORVASC 10 MG TABS (AMLODIPINE BESYLATE) take one tablet by mouth once a day ASPIRIN 81 MG TBEC (ASPIRIN) Take one tablet by mouth twice ad ay ZOCOR 40 MG TABS (SIMVASTATIN) Take one tablet by mouth once a day LANTUS FOR OPTICLIK 100 UNIT/ML SOLN (INSULIN GLARGINE) Inject 25 units in the evening VITAMIN C 500 MG CHEW (ASCORBIC ACID) Take one tablet by mouth once a day PROAIR HFA 108 (90 BASE) MCG/ACT AERS (ALBUTEROL SULFATE) 2 puffs every 6 hours as needed for cough, wheezing FLOVENT DISKUS 250 MCG/BLIST AEPB (FLUTICASONE PROPIONATE (INHAL)) one puff twice daily for next 2 weeks HUMALOG 100 UNIT/ML SOLN (INSULIN LISPRO (HUMAN)) sliding scale coverage * FREESTYLE LIGHT TEST STRIPS use twice daily as directed  Current Allergies (reviewed today): No known allergies History of Present Illness Chief Complaint: sharp pain in left leg/knee X 3 days History of  Present Illness: Left leg and knee pain for the last 3 days.  She has a history of Colon surgery in the past with a chronically swollen leg from her hip all the way to her foot.  No recent redness or increased swelling or warmth.  But she has noticed left knee pain, especially medial & posterior aspect with some radiation distally on medial aspect.  Taking Tylenol and ASA which help a little.  Elevation helps.  She has a pneumatic compression device but since she is moving doesn't use it very much.  She also cannot recall if she has recently hurt her knee or leg by twisting or falling, but says it is possible since there are a lot of kids around.  No fever, chills. No CP, SOB.  REVIEW OF SYSTEMS Constitutional Symptoms      Denies fever, chills, night sweats, weight loss, weight gain, and fatigue.  Eyes       Denies change in vision, eye pain, eye discharge, glasses, contact lenses, and eye surgery. Ear/Nose/Throat/Mouth       Denies hearing loss/aids, change in hearing, ear pain, ear discharge, dizziness, frequent runny  nose, frequent nose bleeds, sinus problems, sore throat, hoarseness, and tooth pain or bleeding.  Respiratory       Denies dry cough, productive cough, wheezing, shortness of breath, asthma, bronchitis, and emphysema/COPD.  Cardiovascular       Denies murmurs, chest pain, and tires easily with exhertion.    Gastrointestinal       Denies stomach pain, nausea/vomiting, diarrhea, constipation, blood in bowel movements, and indigestion. Genitourniary       Denies painful urination, kidney stones, and loss of urinary control. Neurological       Denies paralysis, seizures, and fainting/blackouts. Musculoskeletal       Complains of muscle pain, joint pain, and joint stiffness.      Denies decreased range of motion, redness, swelling, muscle weakness, and gout.      Comments: left leg/knee, edema to left ankle- normal Skin       Denies bruising, unusual mles/lumps or sores, and  hair/skin or nail changes.  Psych       Denies mood changes, temper/anger issues, anxiety/stress, speech problems, depression, and sleep problems. Other Comments: patient c/o of sharp pain to left knee/leg staring about 3 days ago gradually increasing. She says she has had phlebitis before and had simillar symptoms   Past History:  Past Medical History: Reviewed history from 06/15/2009 and no changes required. rectal cancer- radiation, chemo, surgery 2001 AMI x 3 cardiac stents x 5 HTN DM  High cholesterol hypothyroidism obesity R breast cancer 09  Past Surgical History: Reviewed history from 06/15/2009 and no changes required. colostomy, revision 04-2009 gall bladder appendix Hysterectomy R lumpectomy- breast cancer, 2009  Family History: Reviewed history from 06/15/2009 and no changes required. M died CHF, 53 F died CHF, COPD at 29 brother, sister DM, CAD  Social History: Reviewed history from 06/15/2009 and no changes required. 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  Has 3 kids. Never smoked. Rare ETOH. No regular exericise. Physical Exam General appearance: well developed, well nourished, no acute distress Chest/Lungs: no rales, wheezes, or rhonchi bilateral, breath sounds equal without effort Heart: regular rate and  rhythm, no murmur Left knee: ROM is limited due to pain, TTP medial joint line, lateral joint line, and posteromedial knee.  Old surgical scars present. Left leg: swelling from groin to toes, non-pitting, neg Homan's, no warmth, TTP mostly medial aspect minimal on posterior and some anteriorly as well, varicose veins are present Assessment New Problems: LEG PAIN, LEFT (ICD-729.5)  This is most likely knee pain, probably medial meniscal tear from the exam.  I do not believe this is a DVT as she has no increased swelling (from baseline) or other signs, however I will order an U/S to rule out this condition as it is the most  dangerous.  Plan New Orders: New Patient Level III [99203] Lower Extremity Ultrasound with Doppler [Sono LE w/Doppler] Planning Comments:   Continue your daily aspirin We will call you with the U/S results.  If positive, need to go to ER or call your PCP to set up treatment. If negative, need to follow up with orthopedics to discuss your knee pain Compression stockings if test neg, elevation, ice, Tylenol for pain If any CP, SOB, new symptoms, call EMS or go to the ER   The patient and/or caregiver has been counseled thoroughly with regard to medications prescribed including dosage, schedule, interactions, rationale for use, and possible side effects and they verbalize understanding.  Diagnoses and expected course of recovery discussed and  will return if not improved as expected or if the condition worsens. Patient and/or caregiver verbalized understanding.   Orders Added: 1)  New Patient Level III [99203] 2)  Lower Extremity Ultrasound with Doppler [Sono LE w/Doppler]  Appended Document: PAIN IN LEFT LEG & KNEE/KH Ultrasound shows extensive superficial thrombophlebitis of greater saphenous vein, no DVT.  However, with this possible potential of this particular vein to progress to DVT, I spoke to her PCP who wishes to start on Coumadin 5mg  Daily (no bridging Lovanox due to this not being a DVT) and f/u on Monday for INR check. I have educated her an additional 20 mins on the diagnosis and ER precautions. Her husband is also on Coumadin so they understand and will also see PCP on Thursday for his appt.  I will also refer to orthopedics to discuss her knee pain.  Her knee pain is fairly chronic, and with the confusing picture of possible phlebitis on top of it, I think it is reasonable to have her talk to ortho as well.   We will schedule her today for that appointment.

## 2010-02-14 NOTE — Assessment & Plan Note (Signed)
Summary: [name]   Visit Type:  Follow-up Primary Provider:  Seymour Bars DO  CC:  Chest discomfort- Sob.  History of Present Illness: 70 year old female for evaluation of CAD. Patient has had previous stents placed x5. 4 were placed in [place] and one in [place]. The last one was approximately 2 years ago. I have none of those records available. The patient states she has had chest pain for years. She is having substernal chest pain that occasionally radiates to her left shoulder. It is described as a heaviness. There is shortness of breath but no nausea, vomiting or diaphoresis. It is not pleuritic, positional, related to food or exertion. It lasts 2 hours and resolves unchanged. Having a stent placed apparently did not change her pain. She also has dyspnea on exertion. There is no orthopnea or PND but she has had edema in the left lower extremity and a DVT was diagnosed in August. She is on Coumadin. Because of the above we were asked to further evaluate. Note her last episode of chest pain was approximately 5 days ago. She has these episodes approximately 2 times weekly. She did state that her previous cardiologist was aware of these. She also had followup stress test that apparently were unremarkable. We do not have those records available. Note patient's SBP initially 240 in the office and patient complained of headache.  Current Medications (verified): 1)  Prevacid 30 Mg Cpdr (Lansoprazole) .... Take One Tabelt By Mouth Before Breakfast 2)  Coreg 25 Mg Tabs (Carvedilol) .Marland Kitchen.. 1 Tab By Mouth Bid 3)  Clonidine Hcl 0.3 Mg Tabs (Clonidine Hcl) .Marland Kitchen.. 1 Tab By Mouth Tid 4)  Aromasin 25 Mg Tabs (Exemestane) .... Take One Tablet By Mouth Oncea D Ay 5)  Zoloft 100 Mg Tabs (Sertraline Hcl) .... Take One Tablet By Mouth Once A Day 6)  Hydrochlorothiazide 25 Mg Tabs (Hydrochlorothiazide) .... 1/2 Tab By Mouth Daily 7)  Norvasc 10 Mg Tabs (Amlodipine Besylate) .... Take One Tablet By  Mouth Once A Day 8)  Aspirin 81 Mg Tbec (Aspirin) .... Take One Tablet By Mouth Twice Ad Ay 9)  Zocor 40 Mg Tabs (Simvastatin) .... Take One Tablet By Mouth Once A Day 10)  Lantus Solostar 100 Unit/ml Soln (Insulin Glargine) .... Inj 25 Units Every Evening 11)  Freestyle Light Test Strips .... Use Twice Daily As Directed 12)  Humalog Pen 100 Unit/ml Soln (Insulin Lispro (Human)) .... Sliding Scale As Directed 13)  Synthroid 25 Mcg Tabs (Levothyroxine Sodium) .... Take 1 Tablet By Mouth Once A Day 14)  Coumadin 5 Mg Tabs (Warfarin Sodium) .... Sunday - 5 Mg, Monday - 5 Mg, Tuesday - 2.5 Mg, Wednesday - 5 Mg, Thursday - 2.5 Mg, Friday - 5 Mg, Saturday - 5 Mg  Allergies (verified): No Known Drug Allergies  Past History:  Past Medical History: rectal cancer- radiation, chemo, surgery 2001 AMI x 3 cardiac stents x 5 ([place] and [place]) HTN DM  Hyperlipidemia hypothyroidism obesity R breast cancer 09 DVT 08-2009 Chronic back pain  Past Surgical History: colostomy, revision 04-2009 gall bladder appendix Hysterectomy R lumpectomy- breast cancer, 2009 Previous surgery for ankle fracture  Family History: Reviewed history from 10/12/2009 and no changes required. M died CHF, 62 F died CHF, COPD at 65 brother, sister DM, CAD  Social History: Reviewed history from 10/12/2009 and no changes required. Retried from medical billing. Finished HS. Married to [place].  Has 3 kids. Never smoked. Rare ETOH. No regular exericise.  Review of  Systems       Chronic back pain and also headaches but no fevers or chills, productive cough, hemoptysis, dysphasia, odynophagia, melena, hematochezia, dysuria, hematuria, rash, seizure activity, orthopnea, PND,  claudication. Remaining systems are negative.   Vital Signs:  Patient profile:   70 year old female Height:      62.4 inches Weight:      193.75 pounds BMI:     35.11 Pulse rate:   63 / minute Pulse rhythm:   regular Resp:      18 per minute BP sitting:   240 / 90  (left arm) Cuff size:   large  Vitals Entered By: Vikki Ports (October 12, 2009 3:38 PM)  Physical Exam  General:  Well developed/obese in NAD Skin warm/dry Patient not depressed No peripheral clubbing Back-normal HEENT-normal/normal eyelids; No papilledema. Neck supple/normal carotid upstroke bilaterally; no bruits; no JVD; no thyromegaly chest - CTA/ normal expansion CV - RRR/normal S1 and S2; no  rubs or gallops;  PMI nondisplaced; 1/6 systolic ejection murmur. Abdomen -NT/ND, no HSM, no mass, + bowel sounds, no bruit; previous abdominal surgery. Colostomy noted. 2+ femoral pulses, no bruits Ext-2+ edema on the left with increased diameter. No edema on the right. Diminished distal pulses. Neuro-grossly nonfocal     EKG  Procedure date:  10/12/2009  Findings:      Normal sinus rhythm at a rate of 63 and axis normal. Nonspecific ST changes.  Impression & Recommendations:  Problem # 1:  CHEST PAIN (ICD-786.50)  Difficult situation. She apparently has had intermittent chest pain for years but also has had stents placed previously. However this apparently did not change her chest pain. I will obtain records from her previous cardiologist. Note her electrocardiogram is unremarkable. Last episode of pain was 5 days ago. Schedule Myoview for risk stratification after DC. May require cardiac catheterization for definitive evaluation in the future. However she is on Coumadin for recent DVT and has renal insufficiency. Would like to delay if possible. Check enzymes. Her updated medication list for this problem includes:    Coreg 25 Mg Tabs (Carvedilol) .Marland Kitchen... 1 tab by mouth bid    Norvasc 10 Mg Tabs (Amlodipine besylate) .Marland Kitchen... Take one tablet by mouth once a day    Aspirin 81 Mg Tbec (Aspirin) .Marland Kitchen... Take one tablet by mouth twice ad ay    Coumadin 5 Mg Tabs (Warfarin sodium) ..... Sunday - 5 mg, monday - 5 mg, tuesday - 2.5 mg, wednesday - 5  mg, thursday - 2.5 mg, friday - 5 mg, saturday - 5 mg  Her updated medication list for this problem includes:    Coreg 25 Mg Tabs (Carvedilol) .Marland Kitchen... 1 tab by mouth bid    Norvasc 10 Mg Tabs (Amlodipine besylate) .Marland Kitchen... Take one tablet by mouth once a day    Aspirin 81 Mg Tbec (Aspirin) .Marland Kitchen... Take one tablet by mouth twice ad ay    Coumadin 5 Mg Tabs (Warfarin sodium) ..... Sunday - 5 mg, monday - 5 mg, tuesday - 2.5 mg, wednesday - 5 mg, thursday - 2.5 mg, friday - 5 mg, saturday - 5 mg  Problem # 2:  SHORTNESS OF BREATH (ICD-786.05) As above schedule Myoview after D/C. This is not abrupt in onset and it appears to be chronic as well. I do not think she has had a pulmonary embolus. She also is already on Coumadin. Her updated medication list for this problem includes:    Coreg 25 Mg Tabs (Carvedilol) .Marland KitchenMarland KitchenMarland KitchenMarland Kitchen 1  tab by mouth bid    Hydrochlorothiazide 25 Mg Tabs (Hydrochlorothiazide) .Marland Kitchen... 1/2 tab by mouth daily    Norvasc 10 Mg Tabs (Amlodipine besylate) .Marland Kitchen... Take one tablet by mouth once a day    Aspirin 81 Mg Tbec (Aspirin) .Marland Kitchen... Take one tablet by mouth twice ad ay  Problem # 3:  COUMADIN THERAPY (ICD-V58.61) Monitored by primary care. Check INR on admission.  Problem # 4:  HYPERLIPIDEMIA (ICD-272.4) Continue statin. Lipids and liver monitored by primary care. Her updated medication list for this problem includes:    Zocor 40 Mg Tabs (Simvastatin) .Marland Kitchen... Take one tablet by mouth once a day  Problem # 5:  UNSPECIFIED HYPOTHYROIDISM (ICD-244.9)  Her updated medication list for this problem includes:    Synthroid 25 Mcg Tabs (Levothyroxine sodium) .Marland Kitchen... Take 1 tablet by mouth once a day  Problem # 6:  CAD (ICD-414.00) Continue aspirin, beta blocker and statin. Her updated medication list for this problem includes:    Coreg 25 Mg Tabs (Carvedilol) .Marland Kitchen... 1 tab by mouth bid    Norvasc 10 Mg Tabs (Amlodipine besylate) .Marland Kitchen... Take one tablet by mouth once a day    Aspirin 81 Mg Tbec (Aspirin)  .Marland Kitchen... Take one tablet by mouth twice ad ay    Coumadin 5 Mg Tabs (Warfarin sodium) ..... Sunday - 5 mg, monday - 5 mg, tuesday - 2.5 mg, wednesday - 5 mg, thursday - 2.5 mg, friday - 5 mg, saturday - 5 mg  Problem # 7:  IDDM (ICD-250.01)  Her updated medication list for this problem includes:    Aspirin 81 Mg Tbec (Aspirin) .Marland Kitchen... Take one tablet by mouth twice ad ay    Lantus Solostar 100 Unit/ml Soln (Insulin glargine) ..... Inj 25 units every evening    Humalog Pen 100 Unit/ml Soln (Insulin lispro (human)) ..... Sliding scale as directed  Problem # 8:  ESSENTIAL HYPERTENSION, BENIGN (ICD-401.1)  Blood pressure elevated in the office. She was given 0.1 mg of clonidine p.o. twice but systolic BP continued to be elevated at 220. She is also complaining of HA. Admit for hypertensive urgency; begin lisinopril 10 mg by mouth daily and increase as needed; follow renal function closely. IV hydralazine as needed. Her updated medication list for this problem includes:    Coreg 25 Mg Tabs (Carvedilol) .Marland Kitchen... 1 tab by mouth bid    Clonidine Hcl 0.3 Mg Tabs (Clonidine hcl) .Marland Kitchen... 1 tab by mouth tid    Hydrochlorothiazide 25 Mg Tabs (Hydrochlorothiazide) .Marland Kitchen... 1/2 tab by mouth daily    Norvasc 10 Mg Tabs (Amlodipine besylate) .Marland Kitchen... Take one tablet by mouth once a day    Aspirin 81 Mg Tbec (Aspirin) .Marland Kitchen... Take one tablet by mouth twice ad ay  Orders: Clonidine 0.1mg  tab (EMRORAL) Clonidine 0.1mg  tab [facility])   Medication Administration  Medication # 1:    Medication: Clonidine 0.1mg  tab    Diagnosis: ESSENTIAL HYPERTENSION, BENIGN (ICD-401.1)    Dose: 1 tablet    Route: po    Exp Date: 11/12/2009    Lot #: [id number]    Given by: Deliah Goody, RN (October 12, 2009 4:29 PM)  Medication # 2:    Medication: Clonidine 0.1mg  tab    Diagnosis: ESSENTIAL HYPERTENSION, BENIGN (ICD-401.1)    Dose: 1 tablet    Route: po    Exp Date: 11/12/2009    Lot #: [id number]    Comments: REPEAT BP 220/90    Given  by: Deliah Goody, RN (October 12, 2009 5:02  PM)  Orders Added: 1)  Clonidine 0.1mg  tab [EMRORAL] 2)  Clonidine 0.1mg  tab [EMRORAL]

## 2010-02-14 NOTE — Progress Notes (Signed)
Summary: Humalog Rx  Phone Note Call from Patient   Caller: Patient Summary of Call: Pt would like Rx for Humalog 90 day supply. Pt will pick up this afternoon.  Initial call taken by: Payton Spark CMA,  September 02, 2009 1:41 PM    New/Updated Medications: HUMALOG PEN 100 UNIT/ML SOLN (INSULIN LISPRO (HUMAN)) Sliding scale as directed Prescriptions: HUMALOG PEN 100 UNIT/ML SOLN (INSULIN LISPRO (HUMAN)) Sliding scale as directed  #1 box x 0   Entered by:   Payton Spark CMA   Authorized by:   Seymour Bars DO   Signed by:   Payton Spark CMA on 09/02/2009   Method used:   Print then Give to Patient   RxID:   [id number]

## 2010-02-14 NOTE — Assessment & Plan Note (Signed)
Summary: INR check - jr  Nurse Visit   Vitals Entered By: Payton Spark CMA (November 22, 2009 11:14 AM)  Allergies: No Known Drug Allergies Laboratory Results   Blood Tests      INR: 1.2   (Normal Range: 0.88-1.12   Therap INR: 2.0-3.5)    Orders Added: 1)  Fingerstick [36416] 2)  Protime [[id number]]   Anticoagulation Management History:      The patient is on coumadin and comes in today for a routine follow up visit.  Coumadin therapy is being given due to the first episode of deep venous thrombosis and/or pulmonary embolism.  Anticipated length of treatment is 6 months.  Her last INR was 2.1 and today's INR is 1.2.    Anticoagulation Management Assessment/Plan:      The target INR is 2.0-3.0.  She is to have a PT/INR in 2 weeks.  Anticoagulation instructions were given to patient.         Current Anticoagulation Instructions: The patient's dosage of coumadin will be increased.  The new dosage includes:   Coumadin 5 mg tabs:  Sunday - 5 mg, Monday - 5 mg, Tuesday - 5 mg, Wednesday - 5 mg, Thursday - 5 mg, Friday - 5 mg, Saturday - 5 mg.    Repeat PT/INR in 2 weeks.     Appended Document: INR check - jr Pt aware of the above

## 2010-02-14 NOTE — Assessment & Plan Note (Signed)
Summary: [facility]   Visit Type:  Follow-up Primary Provider:  Seymour Bars DO  CC:  Chest discomfort this morning.  History of Present Illness: 70 year old female I recently saw for evaluation of CAD. Patient has had previous stents placed x5. 4 were placed in [place] and one in [place]. Records are unavailable. When I saw her in September of 2011 her blood pressure was extremely elevated and she was having chest pain. She was admitted to [facility] and ruled out for myocardial infarction. A dobutamine Myoview was abnormal. Her Coumadin was held and she underwent cardiac catheterization on October 18, 2009. This revealed a normal left main. There was a 40-50% ostial LAD.There were  multiple stents in the mid LAD, which were widely patent.  The distal  edge of the LAD stents showed a 70-80% eccentric stenosis. First diagonal branch had 50% ostial disease and was somewhat jailed by the stents. There was 50-60% eccentric mid circumflex. There were multiple widely patent stents in the second obtuse marginal branch.  The distal OM branch was diffusely diseased in the 80% range and not suitable for angioplasty. There was a 70% in-stent  restenosis in the proximal RCA. The ostium of the PDA had 40% tubular disease. The patient had PCI of the right coronary artery and LAD with drug-eluting stents. She did have contrast nephropathy transiently afterwards. Note an echocardiogram in September of 2011 showed normal LV function. Also note she was not compliant with her medications and her blood pressure did improve in the hospital when these were reinitiated. She also had a nosebleed in the hospital that required cauterization. TSH was mildly elevated but she had not been taking her Synthroid. Since discharge, She occasionally feels a mild vague discomfort in her chest but this is chronic. She does not have exertional chest pain. There is occasional dyspnea but no orthopnea, PND, pedal edema or  syncope. Her nosebleeds have resolved.  Current Medications (verified): 1)  Coreg 25 Mg Tabs (Carvedilol) .... Take 1/2 Tablet By Mouth Twice A Day 2)  Zoloft 100 Mg Tabs (Sertraline Hcl) .... Take One Tablet By Mouth Once A Day 3)  Norvasc 10 Mg Tabs (Amlodipine Besylate) .... Take One Tablet By Mouth Once A Day 4)  Aspirin 81 Mg Tbec (Aspirin) .... Take One Tablet By Mouth Twice Ad Ay 5)  Lantus Solostar 100 Unit/ml Soln (Insulin Glargine) .... Inj 25 Units Every Evening 6)  Freestyle Light Test Strips .... Use Twice Daily As Directed 7)  Humalog Pen 100 Unit/ml Soln (Insulin Lispro (Human)) .... Sliding Scale As Directed 8)  Synthroid 25 Mcg Tabs (Levothyroxine Sodium) .... Take 1 Tablet By Mouth Once A Day 9)  Plavix 75 Mg Tabs (Clopidogrel Bisulfate) .... Take One Tablet By Mouth Once A Day 10)  Crestor 10 Mg Tabs (Rosuvastatin Calcium) .... Take One Tablet By Mouth Once A Day 11)  Hydralazine Hcl 50 Mg Tabs (Hydralazine Hcl) .... Pt. Takes Two Times A Day Only 12)  Coumadin 5 Mg Tabs (Warfarin Sodium) .... Sunday - 5 Mg, Monday - 5 Mg, Tuesday - 2.5 Mg, Wednesday - 5 Mg, Thursday - 2.5 Mg, Friday - 5 Mg, Saturday - 5 Mg  Allergies (verified): No Known Drug Allergies  Past History:  Past Medical History: rectal cancer- radiation, chemo, surgery 2001 AMI x 3 cardiac stents x 5 ([place] and [place]); RCA drug eluting stent and LAD angioplasty 10-2009 HTN DM  Hyperlipidemia hypothyroidism obesity R breast cancer 09 DVT 08-2009 Chronic  back pain renal insufficiency  Past Surgical History: Reviewed history from 10/12/2009 and no changes required. colostomy, revision 04-2009 gall bladder appendix Hysterectomy R lumpectomy- breast cancer, 2009 Previous surgery for ankle fracture  Social History: Reviewed history from 10/12/2009 and no changes required. 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  Has 3 kids. Never smoked. Rare ETOH. No regular  exericise.  Review of Systems       Problems with fatigue but no fevers or chills, productive cough, hemoptysis, dysphasia, odynophagia, melena, hematochezia, dysuria, hematuria, rash, seizure activity, orthopnea, PND, pedal edema, claudication. Remaining systems are negative.   Vital Signs:  Patient profile:   70 year old female Height:      62.4 inches Weight:      188.75 pounds BMI:     34.20 Pulse rate:   66 / minute Pulse rhythm:   regular Resp:     18 per minute BP sitting:   194 / 95  (left arm) Cuff size:   large  Vitals Entered By: Vikki Ports (November 09, 2009 2:04 PM)  Physical Exam  General:  Well-developed well-nourished in no acute distress.  Skin is warm and dry.  HEENT is normal.  Neck is supple. No thyromegaly.  Chest is clear to auscultation with normal expansion.  Cardiovascular exam is regular rate and rhythm.  Abdominal exam nontender or distended. No masses palpated. Right groin with soft bruit. Extremities show no edema. neuro grossly intact    EKG  Procedure date:  11/09/2009  Findings:      Normal sinus rhythm at a rate of 77. Occasional PACs. Nonspecific ST changes.  Impression & Recommendations:  Problem # 1:  RENAL INSUFFICIENCY (ICD-588.9) Followed by nephrology.  Problem # 2:  CHEST PAIN (ICD-786.50) Vague symptoms at present. No exertional chest pain. No further ischemia evaluation at this point. Her updated medication list for this problem includes:    Coreg 25 Mg Tabs (Carvedilol) .Marland Kitchen... Take 1/2 tablet by mouth twice a day    Norvasc 10 Mg Tabs (Amlodipine besylate) .Marland Kitchen... Take one tablet by mouth once a day    Aspirin 81 Mg Tbec (Aspirin) .Marland Kitchen... Take one tablet by mouth twice ad ay    Plavix 75 Mg Tabs (Clopidogrel bisulfate) .Marland Kitchen... Take one tablet by mouth once a day    Coumadin 5 Mg Tabs (Warfarin sodium) ..... Sunday - 5 mg, monday - 5 mg, tuesday - 2.5 mg, wednesday - 5 mg, thursday - 2.5 mg, friday - 5 mg, saturday - 5  mg  Problem # 3:  COUMADIN THERAPY (ICD-V58.61)  Followed by primary care.  Orders: Arterial Duplex Lower Extremity (Arterial Duplex Low)  Problem # 4:  HYPERLIPIDEMIA (ICD-272.4) Continue statin. Her updated medication list for this problem includes:    Crestor 10 Mg Tabs (Rosuvastatin calcium) .Marland Kitchen... Take one tablet by mouth once a day  Problem # 5:  UNSPECIFIED HYPOTHYROIDISM (ICD-244.9)  Her updated medication list for this problem includes:    Synthroid 25 Mcg Tabs (Levothyroxine sodium) .Marland Kitchen... Take 1 tablet by mouth once a day  Problem # 6:  CAD (ICD-414.00) Continue aspirin, Plavix and statin. Hopefully her Coumadin for her DVT can be discontinued at the end of 6 months. Patient with right groin bruit.Schedule ultrasound. Her updated medication list for this problem includes:    Coreg 25 Mg Tabs (Carvedilol) .Marland Kitchen... Take 1/2 tablet by mouth twice a day    Norvasc 10 Mg Tabs (Amlodipine besylate) .Marland Kitchen... Take one tablet by mouth  once a day    Aspirin 81 Mg Tbec (Aspirin) .Marland Kitchen... Take one tablet by mouth twice ad ay    Plavix 75 Mg Tabs (Clopidogrel bisulfate) .Marland Kitchen... Take one tablet by mouth once a day    Coumadin 5 Mg Tabs (Warfarin sodium) ..... Sunday - 5 mg, monday - 5 mg, tuesday - 2.5 mg, wednesday - 5 mg, thursday - 2.5 mg, friday - 5 mg, saturday - 5 mg  Problem # 7:  ESSENTIAL HYPERTENSION, BENIGN (ICD-401.1) Her blood pressure is elevated. However she is only intermittently taking her medications. I stressed the importance of taking her meds. I asked her to take her hydralazine 50 mg p.o. t.i.d. instead of b.i.d. Note her blood pressure was controlled on this regimen in the hospital. We will follow this and adjust as needed. Her updated medication list for this problem includes:    Coreg 25 Mg Tabs (Carvedilol) .Marland Kitchen... Take 1/2 tablet by mouth twice a day    Norvasc 10 Mg Tabs (Amlodipine besylate) .Marland Kitchen... Take one tablet by mouth once a day    Aspirin 81 Mg Tbec (Aspirin) .Marland Kitchen...  Take one tablet by mouth twice ad ay    Hydralazine Hcl 50 Mg Tabs (Hydralazine hcl) .Marland Kitchen... Pt. takes two times a day only  Problem # 8:  IDDM (ICD-250.01) Management per primary care. Her updated medication list for this problem includes:    Aspirin 81 Mg Tbec (Aspirin) .Marland Kitchen... Take one tablet by mouth twice ad ay    Lantus Solostar 100 Unit/ml Soln (Insulin glargine) ..... Inj 25 units every evening    Humalog Pen 100 Unit/ml Soln (Insulin lispro (human)) ..... Sliding scale as directed  Problem # 9:  PHLEBITIS&THROMBOPHLEBITIS LOWER EXTREM UNSPEC (ICD-451.2) Continue Coumadin for DVT of lower extremity. Managed by primary care. Her updated medication list for this problem includes:    Aspirin 81 Mg Tbec (Aspirin) .Marland Kitchen... Take one tablet by mouth twice ad ay    Plavix 75 Mg Tabs (Clopidogrel bisulfate) .Marland Kitchen... Take one tablet by mouth once a day    Coumadin 5 Mg Tabs (Warfarin sodium) ..... Sunday - 5 mg, monday - 5 mg, tuesday - 2.5 mg, wednesday - 5 mg, thursday - 2.5 mg, friday - 5 mg, saturday - 5 mg  Patient Instructions: 1)  Your physician recommends that you schedule a follow-up appointment in: 3 MONTHS 2)  Your physician has requested that you have a lower extremity arterial duplex.  This test is an ultrasound of the arteries in the legs or arms.  It looks at arterial blood flow in the legs and arms.  Allow one hour for Lower and Upper Arterial scans. There are no restrictions or special instructions.

## 2010-02-14 NOTE — Assessment & Plan Note (Signed)
Summary: HFU unstable angina   Vital Signs:  Patient profile:   70 year old female Height:      62.4 inches Weight:      192 pounds Pulse rate:   66 / minute BP sitting:   230 / 78  (left arm) Cuff size:   large  Vitals Entered By: Kathlene November LPN (November 04, 2009 1:08 PM) CC: hospital follow-up BP elevation- has not had any meds today Comments Clonidine 0.2mg  given at 1:20pmDoristine Church LPN   Primary Care Provider:  Seymour Bars DO  CC:  hospital follow-up BP elevation- has not had any meds today.  History of Present Illness: 70 yo WF presents for HFU visit.  She was admitted 9/28 to 10/11 to [facility] for unstable angina, s/p successful drug - eluting stent to the RCA and angioplasty of the LAD.  She suffered acute on chronic stage IV kidney dz from contrast.    She had prior hx of CAD s/p stenting back  in [place].  She has had markedly high BP readings but pre hospitalization and post, problably seconday to medication non adherenece.  She comes in today for HFU visit and is due for serial BMPs.  She is set up to see nephrology outpt in the next wk.  She had a renal u/s in the hops that appears to be c/w medical renal dz.    She was placed on Synthroid in the hospital for a TSH of 7.9.  She will be due in 3 mos to have this rechecked.    She is on ASA + Plavix + coumadin ( for DVT ) and had a nosebleed in the hosp that required ENT to cauterize her nose.  Has not had any more epistaxis.    She denis any further CP or DOE but c/o fatigue today.  She did NOT take any of her meds today because she was 'too busy'.  Denies HA, palpitations or blurry vision.    She was discharged home with a Hgb of 8.7, a Cr of 3.21, an INR of 2.41, a low Albumin of 3  Anticoagulation Management History:      The patient is on coumadin and comes in today for a routine follow up visit.  She is being anticoagulated because of the first episode of deep venous thrombosis and/or pulmonary embolism.  Anticipated length of  treatment is 6 months.  Her last INR was 1.0 and today's INR is 2.1.    Current Medications (verified): 1)  Prevacid 30 Mg Cpdr (Lansoprazole) .... Take One Tabelt By Mouth Before Breakfast 2)  Coreg 25 Mg Tabs (Carvedilol) .... Take 1/2 Tablet By Mouth Twice A Day 3)  Aromasin 25 Mg Tabs (Exemestane) .... Take One Tablet By Mouth Oncea D Ay 4)  Zoloft 100 Mg Tabs (Sertraline Hcl) .... Take One Tablet By Mouth Once A Day 5)  Norvasc 10 Mg Tabs (Amlodipine Besylate) .... Take One Tablet By Mouth Once A Day 6)  Aspirin 81 Mg Tbec (Aspirin) .... Take One Tablet By Mouth Twice Ad Ay 7)  Lantus Solostar 100 Unit/ml Soln (Insulin Glargine) .... Inj 25 Units Every Evening 8)  Freestyle Light Test Strips .... Use Twice Daily As Directed 9)  Humalog Pen 100 Unit/ml Soln (Insulin Lispro (Human)) .... Sliding Scale As Directed 10)  Synthroid 25 Mcg Tabs (Levothyroxine Sodium) .... Take 1 Tablet By Mouth Once A Day 11)  Coumadin 5 Mg Tabs (Warfarin Sodium) .... Sunday - 5 Mg, Monday - 5  Mg, Tuesday - 2.5 Mg, Wednesday - 5 Mg, Thursday - 2.5 Mg, Friday - 5 Mg, Saturday - 5 Mg 12)  Plavix 75 Mg Tabs (Clopidogrel Bisulfate) .... Take One Tablet By Mouth Once A Day 13)  Crestor 10 Mg Tabs (Rosuvastatin Calcium) .... Take One Tablet By Mouth Once A Day 14)  Hydralazine Hcl 50 Mg Tabs (Hydralazine Hcl) .... Take One Tablet By Mouth Three Times A Day  Allergies (verified): No Known Drug Allergies  Comments:  Nurse/Medical Assistant: The patient's medications and allergies were reviewed with the patient and were updated in the Medication and Allergy Lists. Kathlene November LPN (November 04, 2009 1:10 PM)  Past History:  Past Medical History: rectal cancer- radiation, chemo, surgery 2001 AMI x 3 cardiac stents x 5 ([place]); RCA drug eluting stent and LAD angioplasty 10-2009 HTN DM  Hyperlipidemia hypothyroidism obesity R breast cancer 09 DVT 08-2009 Chronic back pain  Past Surgical  History: Reviewed history from 10/12/2009 and no changes required. colostomy, revision 04-2009 gall bladder appendix Hysterectomy R lumpectomy- breast cancer, 2009 Previous surgery for ankle fracture  Family History: Reviewed history from 10/12/2009 and no changes required. M died CHF, 43 F died CHF, COPD at 50 brother, sister DM, CAD  Social History: Reviewed history from 10/12/2009 and no changes required. Retried from medical billing. Finished HS. Married to [place].  Has 3 kids. Never smoked. Rare ETOH. No regular exericise.  Review of Systems      See HPI  Physical Exam  General:  alert, well-developed, well-nourished, and well-hydrated.  obese Head:  normocephalic and atraumatic.   Eyes:  pupils equal, pupils round, and pupils reactive to light.   Mouth:  pharynx pink and moist.   Neck:  no masses.   Lungs:  Clear to auscultation bilaterally with no wheezes, rales, rhonchi or crackles.  Heart:  normal rate, regular rhythm, no murmur, no gallop, and no rub.   Pulses:  2+ radial pulses Extremities:  1+ left pedal edema.   Skin:  color normal. cath site well healed in groin Cervical Nodes:  No lymphadenopathy noted   Impression & Recommendations:  Problem # 1:  CAD (ICD-414.00) Reviewed hosp discharge notes.  She has f/u wtih Dr Jens Som next wk.  I reviewed her appt information with her.  She is not having CP or DOE but has some fatigue.  HR OK -- had bradycardia in the hospital.  She has been non compliant with ALL of her meds and this poses a large concern that she may not be taking her Plavix and her BP is VERY high today, see #2.   The following medications were removed from the medication list:    Clonidine Hcl 0.3 Mg Tabs (Clonidine hcl) .Marland Kitchen... 1 tab by mouth tid    Hydrochlorothiazide 25 Mg Tabs (Hydrochlorothiazide) .Marland Kitchen... 1/2 tab by mouth daily Her updated medication list for this problem includes:    Coreg 25 Mg Tabs (Carvedilol) .Marland Kitchen... Take 1/2 tablet by  mouth twice a day    Norvasc 10 Mg Tabs (Amlodipine besylate) .Marland Kitchen... Take one tablet by mouth once a day    Aspirin 81 Mg Tbec (Aspirin) .Marland Kitchen... Take one tablet by mouth twice ad ay    Plavix 75 Mg Tabs (Clopidogrel bisulfate) .Marland Kitchen... Take one tablet by mouth once a day    Hydralazine Hcl 50 Mg Tabs (Hydralazine hcl) .Marland Kitchen... Take one tablet by mouth three times a day  Orders: T-Basic Metabolic Panel [id number])  Problem # 2:  ESSENTIAL HYPERTENSION, BENIGN (ICD-401.1) Assessment: Deteriorated Pt asymptomatic with VERY high BP reading today.  She has not been taking her meds.  We had a long talk about her risk of death by being non compliant.  She was given 0.2 mg of Clonidine in the office today and her SBP dropped 30 points in 20 min.  She was advised to go home and take the rest of her usual meds.  Call if any HAs, blurry vision, CP or DOE occurs.  has f/u with renal and cards in the next wk to follow her BPs.   The following medications were removed from the medication list:    Clonidine Hcl 0.3 Mg Tabs (Clonidine hcl) .Marland Kitchen... 1 tab by mouth tid    Hydrochlorothiazide 25 Mg Tabs (Hydrochlorothiazide) .Marland Kitchen... 1/2 tab by mouth daily Her updated medication list for this problem includes:    Coreg 25 Mg Tabs (Carvedilol) .Marland Kitchen... Take 1/2 tablet by mouth twice a day    Norvasc 10 Mg Tabs (Amlodipine besylate) .Marland Kitchen... Take one tablet by mouth once a day    Hydralazine Hcl 50 Mg Tabs (Hydralazine hcl) .Marland Kitchen... Take one tablet by mouth three times a day  Orders: T-Basic Metabolic Panel [phone])  BP today: 230/78 Prior BP: 240/90 (10/12/2009)  Labs Reviewed: K+: 5.0 (09/16/2009) Creat: : 1.57 (09/16/2009)     Problem # 3:  RENAL INSUFFICIENCY (ICD-588.9) BMP today. Had acute on chronic renal insuff from contrast during cath even though she was pre- treated. Renal u/s done indicating medical renal dz, no hydronephrosis.  Had 2 hr urine, SPEP and UPEP done.   Has f/u with Dr Lowell Guitar next wk.  Problem  # 4:  PHLEBITIS&THROMBOPHLEBITIS LOWER EXTREM UNSPEC (ICD-451.2) Coumadin adjusted today.  Will be on Coumadin until Jan. The following medications were removed from the medication list:    Coumadin 5 Mg Tabs (Warfarin sodium) ..... Sunday - 5 mg, monday - 5 mg, tuesday - 2.5 mg, wednesday - 5 mg, thursday - 2.5 mg, friday - 5 mg, saturday - 5 mg Her updated medication list for this problem includes:    Aspirin 81 Mg Tbec (Aspirin) .Marland Kitchen... Take one tablet by mouth twice ad ay    Plavix 75 Mg Tabs (Clopidogrel bisulfate) .Marland Kitchen... Take one tablet by mouth once a day    Coumadin 5 Mg Tabs (Warfarin sodium) ..... Sunday - 5 mg, monday - 5 mg, tuesday - 2.5 mg, wednesday - 5 mg, thursday - 2.5 mg, friday - 5 mg, saturday - 5 mg  Orders: Fingerstick (09811) Protime ([id number])  Problem # 5:  UNSPECIFIED HYPOTHYROIDISM (ICD-244.9) TSH7.9 during hosp.  Started back on Synthroid.  REcheck TSH in 3 mos. Her updated medication list for this problem includes:    Synthroid 25 Mcg Tabs (Levothyroxine sodium) .Marland Kitchen... Take 1 tablet by mouth once a day  Labs Reviewed: TSH: 5.583 (09/16/2009)     Problem # 6:  IDDM (ICD-250.01) Sugars running HIGH.  Labs today.  Pt has NOT be taking her insulin.  We discussed the repercussions of pt not taking her meds.   Her updated medication list for this problem includes:    Aspirin 81 Mg Tbec (Aspirin) .Marland Kitchen... Take one tablet by mouth twice ad ay    Lantus Solostar 100 Unit/ml Soln (Insulin glargine) ..... Inj 25 units every evening    Humalog Pen 100 Unit/ml Soln (Insulin lispro (human)) ..... Sliding scale as directed  Orders: T- Hemoglobin A1C ([id number])  Complete Medication List: 1)  Prevacid 30  Mg Cpdr (Lansoprazole) .... Take one tabelt by mouth before breakfast 2)  Coreg 25 Mg Tabs (Carvedilol) .... Take 1/2 tablet by mouth twice a day 3)  Aromasin 25 Mg Tabs (Exemestane) .... Take one tablet by mouth oncea d ay 4)  Zoloft 100 Mg Tabs (Sertraline hcl) .... Take  one tablet by mouth once a day 5)  Norvasc 10 Mg Tabs (Amlodipine besylate) .... Take one tablet by mouth once a day 6)  Aspirin 81 Mg Tbec (Aspirin) .... Take one tablet by mouth twice ad ay 7)  Lantus Solostar 100 Unit/ml Soln (Insulin glargine) .... Inj 25 units every evening 8)  Freestyle Light Test Strips  .... Use twice daily as directed 9)  Humalog Pen 100 Unit/ml Soln (Insulin lispro (human)) .... Sliding scale as directed 10)  Synthroid 25 Mcg Tabs (Levothyroxine sodium) .... Take 1 tablet by mouth once a day 11)  Plavix 75 Mg Tabs (Clopidogrel bisulfate) .... Take one tablet by mouth once a day 12)  Crestor 10 Mg Tabs (Rosuvastatin calcium) .... Take one tablet by mouth once a day 13)  Hydralazine Hcl 50 Mg Tabs (Hydralazine hcl) .... Take one tablet by mouth three times a day 14)  Coumadin 5 Mg Tabs (Warfarin sodium) .... Sunday - 5 mg, monday - 5 mg, tuesday - 2.5 mg, wednesday - 5 mg, thursday - 2.5 mg, friday - 5 mg, saturday - 5 mg  Anticoagulation Management Assessment/Plan:      The target INR is 2.0-3.0.  She is to have a PT/INR in 4 weeks.  Anticoagulation instructions were given to patient.         Current Anticoagulation Instructions: The patient is to continue with the same dose of coumadin.  This dosage includes:  Coumadin 5 mg tabs:  Sunday - 5 mg, Monday - 5 mg, Tuesday - 2.5 mg, Wednesday - 5 mg, Thursday - 2.5 mg, Friday - 5 mg, Saturday - 5 mg.    Repeat PT/INR in 4 weeks.    Patient Instructions: 1)  TAKE ALL OF YOUR MEDICATIONS. 2)  SCHEDULE FOLLOW UP DIABETES/ BP IN 2 WKS. 3)  STAY ON CURRENT DOSE OF COUMADIN.   4)  LABS DOWNSTAIRS TODAY. 5)  WILL CALL YOU W/ RESULTS ON MONDAY.   Orders Added: 1)  Fingerstick [36416] 2)  Protime [[id number]] 3)  T-Basic Metabolic Panel [[id number]] 4)  T- Hemoglobin A1C [[id number]] 5)  Est. Patient Level V [[address]]    Laboratory Results   Blood Tests   Date/Time Received: 11/04/2009   INR: 2.1   (Normal Range:  0.88-1.12   Therap INR: 2.0-3.5)

## 2010-02-14 NOTE — Letter (Signed)
Summary: [facility]   Imported By: Lanelle Bal 09/06/2009 11:38:22  _____________________________________________________________________  External Attachment:    Type:   Image     Comment:   External Document

## 2010-02-14 NOTE — Letter (Signed)
Summary:  [facility]   Imported By: Lanelle Bal 11/28/2009 08:36:39  _____________________________________________________________________  External Attachment:    Type:   Image     Comment:   External Document
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
## 2010-02-14 NOTE — Assessment & Plan Note (Signed)
Summary: Flu shot- jr  Nurse Visit   Vitals Entered By: Payton Spark CMA (November 07, 2009 11:36 AM)  Allergies: No Known Drug Allergies  Orders Added: 1)  Flu Vaccine 27yrs + MEDICARE PATIENTS [Q2039] 2)  Administration Flu vaccine - MCR [G0008] Flu Vaccine Consent Questions     Do you have a history of severe allergic reactions to this vaccine? no    Any prior history of allergic reactions to egg and/or gelatin? no    Do you have a sensitivity to the preservative Thimersol? no    Do you have a past history of Guillan-Barre Syndrome? no    Do you currently have an acute febrile illness? no    Have you ever had a severe reaction to latex? no    Vaccine information given and explained to patient? yes    Are you currently pregnant? no    Lot Number:[id number]   Exp Date:07/15/2010   Site Given  Left Deltoid IMmedflu

## 2010-02-14 NOTE — Assessment & Plan Note (Signed)
Summary: NOV est care   Vital Signs:  Patient profile:   70 year old female Height:      62.4 inches Weight:      198 pounds BMI:     35.88 O2 Sat:      98 % on Room air Temp:     98.2 degrees F oral Pulse rate:   51 / minute BP sitting:   182 / 73  (left arm) Cuff size:   large  Vitals Entered By: Kathlene November (June 15, 2009 11:09 AM)  O2 Flow:  Room air Is Patient Diabetic? Yes   Primary Care Provider:  Seymour Bars DO   History of Present Illness: 70 yo WF presents for NOV.  She and her husband just moved here 2 wks ago from [place].  She has been tired from the move.   She has hx of rectal cancer from 23-Jun-1999 and hx of breast cancer from 23-Jun-2007.  She has been seen by [facility] last wk for a recent ostomy revision with leakage.    She checks home BPs and reports a hx of white coat HTN. She had labs drawn in the past 6 mos. Feels well overall.  Has a complicated hx of CAD with 3 reported MIs in the past, HTN, T2DM, high cholesterol and hypothryoidism.  She has all of her meds and is asymptomatic.  She saw her oncologist and cardiologist just prior to moving here in April.      Habits & Providers  Alcohol-Tobacco-Diet     Alcohol drinks/day: <1     Tobacco Status: never  Exercise-Depression-Behavior     Does Patient Exercise: no     Have you felt down or hopeless? no     STD Risk: never     Drug Use: never     Seat Belt Use: always  Current Medications (verified): 1)  Prevacid 30 Mg Cpdr (Lansoprazole) .... Take One Tabelt By Mouth Before Breakfast 2)  Coreg 12.5 Mg Tabs (Carvedilol) .... Take One Tablet By Mouth Twice A Day With Food 3)  Clonidine Hcl 0.3 Mg Tabs (Clonidine Hcl) .... Take One Tablet By Mouth Three Times A Day 4)  Aromasin 25 Mg Tabs (Exemestane) .... Take One Tablet By Mouth Oncea D Ay 5)  Zoloft 100 Mg Tabs (Sertraline Hcl) .... Take One Tablet By Mouth Once A Day 6)  Hydrochlorothiazide 25 Mg Tabs (Hydrochlorothiazide) .... Take One  Tabelt By Mouth Once A Day 7)  Norvasc 10 Mg Tabs (Amlodipine Besylate) .... Take One Tablet By Mouth Once A Day 8)  Aspirin 81 Mg Tbec (Aspirin) .... Take One Tablet By Mouth Twice Ad Ay 9)  Zocor 40 Mg Tabs (Simvastatin) .... Take One Tablet By Mouth Once A Day 10)  Lantus For Opticlik 100 Unit/ml Soln (Insulin Glargine) .... Inject 25 Units in The Evening 11)  Vitamin C 500 Mg Chew (Ascorbic Acid) .... Take One Tablet By Mouth Once A Day  Allergies (verified): No Known Drug Allergies  Comments:  Nurse/Medical Assistant: The patient's medications and allergies were reviewed with the patient and were updated in the Medication and Allergy Lists. Kathlene November (June 15, 2009 11:16 AM)  Past History:  Past Medical History: rectal cancer- radiation, chemo, surgery 1999/06/23 AMI x 3 cardiac stents x 5 HTN DM  High cholesterol hypothyroidism obesity R breast cancer 09  Past Surgical History: colostomy, revision 04-2009 gall bladder appendix Hysterectomy R lumpectomy- breast cancer, 06/23/07  Family History: M died  CHF, 26 F died CHF, COPD at 79 brother, sister DM, CAD  Social History: Retried from medical billing. Finished HS. Married to [place].  Has 3 kids. Never smoked. Rare ETOH. No regular exericise.Smoking Status:  never Does Patient Exercise:  no STD Risk:  never Drug Use:  never Seat Belt Use:  always  Review of Systems       no fevers/sweats/weakness, unexplained wt loss/gain, no change in vision, no difficulty hearing, ringing in ears, + hay fever/allergies, no CP/discomfort, no palpitations, no breast lump/nipple discharge, no cough/wheeze, no blood in stool, no N/V/D, + nocturia, + leaking urine, no unusual vag bleeding, no vaginal/penile discharge, no muscle/joint pain, no rash, no new/changing mole, no HA, no memory loss, no anxiety, no sleep problem, + depression, no unexplained lumps, no easy bruising/bleeding, no concern with sexual function   Physical  Exam  General:  obese WF in NAD, here with husband Head:  normocephalic and atraumatic.   Eyes:  sclera non icteric, wearing glasses Ears:  no external deformities.   Nose:  no nasal discharge.   Mouth:  pharynx pink and moist.   Neck:  no masses.   Lungs:  Normal respiratory effort, chest expands symmetrically. Lungs are clear to auscultation, no crackles or wheezes. Heart:  normal rate, regular rhythm, and no murmur.   Abdomen:  midline incisional scar healing well with diffuse voluntary guarding, soft abdomen, NABS with LLQ ostomy, pink stump Extremities:  1+ non pitting LE edema bilat Skin:  color normal.   Cervical Nodes:  No lymphadenopathy noted Psych:  good eye contact and flat affect.     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  She has already est care with digestive health specialists here and is getting ostomy supplies.  She will need to est care with oncologist locally for both hx of rectal CA and breast CA in 09, still on Arimidex.  Orders: Oncology Referral (Oncology)  Problem # 2:  CAD (ICD-414.00)  AMI x 3 by hx.  Will get records.  STable at this time other than High BP -- which she states was normal at home.  Continue current meds and will est care with Dr Jens Som for F/u this fall. Her updated medication list for this problem includes:    Coreg 12.5 Mg Tabs (Carvedilol) .Marland Kitchen... Take one tablet by mouth twice a day with food    Clonidine Hcl 0.3 Mg Tabs (Clonidine hcl) .Marland Kitchen... Take one tablet by mouth three times a day    Hydrochlorothiazide 25 Mg Tabs (Hydrochlorothiazide) .Marland Kitchen... Take one tabelt by mouth once a day    Norvasc 10 Mg Tabs (Amlodipine besylate) .Marland Kitchen... Take one tablet by mouth once a day    Aspirin 81 Mg Tbec (Aspirin) .Marland Kitchen... Take one tablet by mouth twice ad ay  Orders: Cardiology Referral (Cardiology)  Problem # 3:  IDDM (ICD-250.01) Will get old  records.  According to pt she just had her A1C done in the past month and is doing well on current meds.  When RFing, will change her Lantus to [employer].  F/U in 4 mos.  Her updated medication list for this problem includes:    Aspirin 81 Mg Tbec (Aspirin) .Marland Kitchen... Take one tablet by mouth twice ad ay    Lantus For Opticlik 100 Unit/ml Soln (Insulin glargine) ..... Inject 25 units in the evening  Problem # 4:  ESSENTIAL  HYPERTENSION, BENIGN (ICD-401.1) RTC for a nurse BP check in 1 month.  Not at goal here. Her updated medication list for this problem includes:    Coreg 12.5 Mg Tabs (Carvedilol) .Marland Kitchen... Take one tablet by mouth twice a day with food    Clonidine Hcl 0.3 Mg Tabs (Clonidine hcl) .Marland Kitchen... Take one tablet by mouth three times a day    Hydrochlorothiazide 25 Mg Tabs (Hydrochlorothiazide) .Marland Kitchen... Take one tabelt by mouth once a day    Norvasc 10 Mg Tabs (Amlodipine besylate) .Marland Kitchen... Take one tablet by mouth once a day  BP today: 182/73  Complete Medication List: 1)  Prevacid 30 Mg Cpdr (Lansoprazole) .... Take one tabelt by mouth before breakfast 2)  Coreg 12.5 Mg Tabs (Carvedilol) .... Take one tablet by mouth twice a day with food 3)  Clonidine Hcl 0.3 Mg Tabs (Clonidine hcl) .... Take one tablet by mouth three times a day 4)  Aromasin 25 Mg Tabs (Exemestane) .... Take one tablet by mouth oncea d ay 5)  Zoloft 100 Mg Tabs (Sertraline hcl) .... Take one tablet by mouth once a day 6)  Hydrochlorothiazide 25 Mg Tabs (Hydrochlorothiazide) .... Take one tabelt by mouth once a day 7)  Norvasc 10 Mg Tabs (Amlodipine besylate) .... Take one tablet by mouth once a day 8)  Aspirin 81 Mg Tbec (Aspirin) .... Take one tablet by mouth twice ad ay 9)  Zocor 40 Mg Tabs (Simvastatin) .... Take one tablet by mouth once a day 10)  Lantus For Opticlik 100 Unit/ml Soln (Insulin glargine) .... Inject 25 units in the evening 11)  Vitamin C 500 Mg Chew (Ascorbic acid) .... Take one tablet by mouth once a  day  Patient Instructions: 1)  Will get you set up to see oncology and Cardiology in September. 2)  Continue current meds. 3)  Return for a nurse visit BP check in 1 month. 4)  REturn to see me in 4 mos.

## 2010-02-14 NOTE — Progress Notes (Signed)
Summary: Pt needs a call back  Phone Note Call from Patient   Caller: Daughter Summary of Call: Daughter-Ms.Slozer called because she is concerned about her mom's INR since it was a little elevated last time it was checked and now today her left eye hasa  little bleed in it and is red.... She wants to know if you will at least check her INR today if you can not look at her eye today? Let me know if you want me to add her on to your afternoon schedule.... She wants a call back on her cell at [phone] Initial call taken by: Michaelle Copas,  December 12, 2009 1:18 PM  Follow-up for Phone Call        can add to nurse sched for INR today.  Follow-up by: Seymour Bars DO,  December 12, 2009 1:28 PM

## 2010-02-14 NOTE — Assessment & Plan Note (Signed)
Summary: PT  Nurse Visit   Vitals Entered By: Payton Spark CMA (August 25, 2009 10:27 AM)  Allergies: No Known Drug Allergies Laboratory Results   Blood Tests      INR: 2.2   (Normal Range: 0.88-1.12   Therap INR: 2.0-3.5)    Orders Added: 1)  Fingerstick [36416] 2)  Protime [[id number]]   Anticoagulation Management History:      The patient is on coumadin and comes in today for a routine follow up visit.  She is being anticoagulated due to the first episode of deep venous thrombosis and/or pulmonary embolism.  Anticipated length of treatment is 6 months.  Her last INR was 3.7 and today's INR is 2.2.    Anticoagulation Management Assessment/Plan:      The target INR is 2.0-3.0.  She is to have a PT/INR in 2 weeks.  Anticoagulation instructions were given to patient.         Current Anticoagulation Instructions: The patient is to continue with the same dose of coumadin.  This dosage includes:   Coumadin 5 mg tabs and Coumadin 5 mg tabs:  Sunday - 5 mg, Monday - 2.5 mg, Tuesday - 5 mg, Wednesday - 2.5 mg, Thursday - 5 mg, Friday - 2.5 mg, Saturday - 2.5 mg.    Repeat PT/INR in 2 weeks.

## 2010-02-14 NOTE — Assessment & Plan Note (Signed)
Summary: P.T check-jr  Nurse Visit   Vitals Entered By: Payton Spark CMA (September 08, 2009 1:34 PM)  Anticoagulation Management History:      The patient is on coumadin and comes in today for a routine follow up visit.  She is being anticoagulated due to the first episode of deep venous thrombosis and/or pulmonary embolism.  Anticipated length of treatment is 6 months.  Her last INR was 2.2 and today's INR is 4.1.     Allergies: No Known Drug Allergies Laboratory Results   Blood Tests      INR: 4.1   (Normal Range: 0.88-1.12   Therap INR: 2.0-3.5)    Orders Added: 1)  Fingerstick [36416] 2)  Protime [[id number]]   Anticoagulation Management Assessment/Plan:      The target INR is 2.0-3.0.  She is to have a PT/INR in 1 week.  Anticoagulation instructions were given to patient.         Current Anticoagulation Instructions: The patient is to hold (do not take) the Thursday dose of coumadin.  The dosage to be resumed includes: Coumadin 5 mg tabs and Coumadin 5 mg tabs:  Sunday - 5 mg, Monday - 2.5 mg, Tuesday - 5 mg, Wednesday - 2.5 mg, Thursday - 5 mg, Friday - 2.5 mg, Saturday - 2.5 mg.  Repeat PT/INR in 1 week.

## 2010-02-14 NOTE — Progress Notes (Signed)
Summary: Screening mammogram only  Phone Note From Other Clinic   Caller: [facility] Summary of Call: New order needed for screening mammo of the L breast.  Initial call taken by: Payton Spark CMA,  August 16, 2009 1:22 PM  New Problems: OTHER SCREENING MAMMOGRAM (ICD-V76.12)   New Problems: OTHER SCREENING MAMMOGRAM (ICD-V76.12)

## 2010-02-14 NOTE — Assessment & Plan Note (Signed)
Summary: INR check - jr  Nurse Visit   Allergies: No Known Drug Allergies Laboratory Results   Blood Tests      INR: 3.5   (Normal Range: 0.88-1.12   Therap INR: 2.0-3.5)    Orders Added: 1)  Fingerstick [36416] 2)  Protime [[id number]]   Anticoagulation Management History:      The patient is on coumadin and comes in today for a routine follow up visit.  Coumadin therapy is being given due to the first episode of deep venous thrombosis and/or pulmonary embolism.  Anticipated length of treatment is 6 months.  Her last INR was 6.1 and today's INR is 3.5.    Anticoagulation Management Assessment/Plan:      The target INR is 2.0-3.0.  Anticoagulation instructions were given to patient.         Current Anticoagulation Instructions: Hold today's Coumadin.  Coumadin 5 mg tabs:  Sunday - 5 mg, Monday - 2.5 mg, Tuesday - 5 mg, Wednesday - 2.5 mg, Thursday - 5 mg, Friday - 2.5 mg, Saturday - 2.5 mg.    recheck in 1 wk.

## 2010-02-16 NOTE — Assessment & Plan Note (Signed)
Summary: nurse INR  Nurse Visit   Primary Care Provider:  Seymour Bars DO   History of Present Illness: Needs written rx for Lantus and Humalog   Allergies: No Known Drug Allergies Laboratory Results   Blood Tests   Date/Time Received: 01/03/10 Date/Time Reported: 01/03/10   INR: 3.7   (Normal Range: 0.88-1.12   Therap INR: 2.0-3.5)    Orders Added: 1)  Fingerstick [36416] 2)  Protime INR [[id number]] Prescriptions: WARFARIN SODIUM 4 MG TABS (WARFARIN SODIUM) Take 1 tablet by mouth once a day as needed  #30 x 1   Entered and Authorized by:   Nani Gasser MD   Signed by:   Nani Gasser MD on 01/03/2010   Method used:   Electronically to        [facility] [phone]* (retail)       [address] [place], [place]  [address]       Ph: [phone]       Fax: [phone]   RxID:   [id number] HUMALOG PEN 100 UNIT/ML SOLN (INSULIN LISPRO (HUMAN)) Sliding scale as directed  #90 x 0   Entered by:   Avon Gully CMA, (AAMA)   Authorized by:   Nani Gasser MD   Signed by:   Avon Gully CMA, (AAMA) on 01/03/2010   Method used:   Print then Give to Patient   RxID:   [id number] LANTUS SOLOSTAR 100 UNIT/ML SOLN (INSULIN GLARGINE) Inj 25 units every evening  #90 x 0   Entered by:   Avon Gully CMA, (AAMA)   Authorized by:   Nani Gasser MD   Signed by:   Avon Gully CMA, (AAMA) on 01/03/2010   Method used:   Print then Give to Patient   RxID:   [id number]

## 2010-02-16 NOTE — Letter (Signed)
Summary: Phase II - Cardiac Rehab  Phase II - Cardiac Rehab   Imported By: Marylou Mccoy 01/05/2010 16:00:23  _____________________________________________________________________  External Attachment:    Type:   Image     Comment:   External Document

## 2010-02-16 NOTE — Assessment & Plan Note (Signed)
Summary: [facility]   Visit Type:  Follow-up Primary Provider:  Seymour Bars DO   History of Present Illness: Pleasant female for F/U of hypertension and  CAD. Patient has had previous stents placed x5. 4 were placed in [place] and one in [place]. Records are unavailable. In Sept 2011 she was admitted to [facility] and ruled out for myocardial infarction. A dobutamine Myoview was abnormal. Her Coumadin was held and she underwent cardiac catheterization on October 18, 2009. This revealed a normal left main. There was a 40-50% ostial LAD.There were  multiple stents in the mid LAD, which were widely patent.  The distal  edge of the LAD stents showed a 70-80% eccentric stenosis. First diagonal branch had 50% ostial disease and was somewhat jailed by the stents. There was 50-60% eccentric mid circumflex. There were multiple widely patent stents in the second obtuse marginal branch.  The distal OM branch was diffusely diseased in the 80% range and not suitable for angioplasty. There was a 70% in-stent  restenosis in the proximal RCA. The ostium of the PDA had 40% tubular disease. The patient had PCI of the right coronary artery and LAD with drug-eluting stents. She did have contrast nephropathy transiently afterwards. Note an echocardiogram in September of 2011 showed normal LV function. Also note she was not compliant with her medications and her blood pressure did improve in the hospital when these were reinitiated. She also had a nosebleed in the hospital that required cauterization. TSH was mildly elevated but she had not been taking her Synthroid. Since I last saw her in Oct 2011, she denies dyspnea, chest pain, palpitations or syncope. She is only taking her hydralazine 50 mg p.o. twice daily.  Current Medications (verified): 1)  Coreg 25 Mg Tabs (Carvedilol) .... Take 1/2 Tablet By Mouth Twice A Day 2)  Norvasc 10 Mg Tabs (Amlodipine Besylate) .... Take One Tablet By Mouth Once A  Day 3)  Aspirin 81 Mg Tbec (Aspirin) .... Take One Tablet By Mouth Twice Ad Ay 4)  Lantus Solostar 100 Unit/ml Soln (Insulin Glargine) .... Inj 25 Units Every Evening 5)  Freestyle Light Test Strips .... Use Twice Daily As Directed 6)  Humalog Pen 100 Unit/ml Soln (Insulin Lispro (Human)) .... Sliding Scale As Directed 7)  Synthroid 25 Mcg Tabs (Levothyroxine Sodium) .... Take 1 Tablet By Mouth Once A Day 8)  Plavix 75 Mg Tabs (Clopidogrel Bisulfate) .... Take One Tablet By Mouth Once A Day 9)  Crestor 10 Mg Tabs (Rosuvastatin Calcium) .... Take One Tablet By Mouth Once A Day 10)  Hydralazine Hcl 50 Mg Tabs (Hydralazine Hcl) .... Pt. Takes Two Times A Day Only 11)  Nystatin  Powd (Nystatin) .... Apply Under Breasts 2 X A Day 12)  Lisinopril-Hydrochlorothiazide 20-12.5 Mg Tabs (Lisinopril-Hydrochlorothiazide) .Marland Kitchen.. 1 Tab By Mouth Daily 13)  Warfarin Sodium 4 Mg Tabs (Warfarin Sodium) .... Take 1 Tablet By Mouth Once A Day As Needed  Allergies (verified): No Known Drug Allergies  Past History:  Past Medical History: Reviewed history from 11/09/2009 and no changes required. rectal cancer- radiation, chemo, surgery 2001 AMI x 3 cardiac stents x 5 ([place] and [place]); RCA drug eluting stent and LAD angioplasty 10-2009 HTN DM  Hyperlipidemia hypothyroidism obesity R breast cancer 09 DVT 08-2009 Chronic back pain renal insufficiency  Past Surgical History: Reviewed history from 10/12/2009 and no changes required. colostomy, revision 04-2009 gall bladder appendix Hysterectomy R lumpectomy- breast cancer, 2009 Previous surgery for ankle fracture  Social History: Reviewed history from 10/12/2009 and no changes required. 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  Has 3 kids. Never smoked. Rare ETOH. No regular exericise.  Review of Systems       no fevers or chills, productive cough, hemoptysis, dysphasia, odynophagia, melena, hematochezia, dysuria,  hematuria, rash, seizure activity, orthopnea, PND, pedal edema, claudication. Remaining systems are negative.   Vital Signs:  Patient profile:   70 year old female Height:      62.4 inches Weight:      196 pounds BMI:     35.52 Pulse rate:   49 / minute Pulse rhythm:   regular BP sitting:   180 / 75  (left arm) Cuff size:   large  Vitals Entered By: Vikki Ports (January 25, 2010 1:52 PM)  Physical Exam  General:  Well-developed well-nourished in no acute distress.  Skin is warm and dry.  HEENT is normal.  Neck is supple. No thyromegaly.  Chest is clear to auscultation with normal expansion.  Cardiovascular exam is regular rate and rhythm.  Abdominal exam nontender or distended. No masses palpated. Extremities show no edema. neuro grossly intact    EKG  Procedure date:  01/25/2010  Findings:      Sinus bradycardia at a rate of 49. Nonspecific ST changes.  Impression & Recommendations:  Problem # 1:  RENAL INSUFFICIENCY (ICD-588.9) Followed by nephrology.  Problem # 2:  COUMADIN THERAPY (ICD-V58.61) Managed by primary care.  Problem # 3:  HYPERLIPIDEMIA (ICD-272.4) Continue statin. Lipids and liver monitored by primary care. Goal LDL less than 70. Her updated medication list for this problem includes:    Crestor 10 Mg Tabs (Rosuvastatin calcium) .Marland Kitchen... Take one tablet by mouth once a day  Problem # 4:  CAD (ICD-414.00) Continue aspirin, Plavix, beta blocker and statin. Patient finds it difficult to travel to [place]. She would prefer to be followed at [facility]. We will be happy to forward her records to the cardiologist of her choosing when she makes that decision. The following medications were removed from the medication list:    Coumadin 5 Mg Tabs (Warfarin sodium) ..... Sunday - 4 mg, monday - 5 mg, tuesday - 4 mg, wednesday - 4 mg, thursday - 4 mg, friday - 5 mg, saturday - 5 mg    Coumadin 4 Mg Tabs (Warfarin sodium) ..... Sunday - 4 mg, monday - 5 mg,  tuesday - 4 mg, wednesday - 4 mg, thursday - 4 mg, friday - 5 mg, saturday - 5 mg Her updated medication list for this problem includes:    Coreg 25 Mg Tabs (Carvedilol) .Marland Kitchen... Take 1/2 tablet by mouth twice a day    Norvasc 10 Mg Tabs (Amlodipine besylate) .Marland Kitchen... Take one tablet by mouth once a day    Aspirin 81 Mg Tbec (Aspirin) .Marland Kitchen... Take one tablet by mouth twice ad ay    Plavix 75 Mg Tabs (Clopidogrel bisulfate) .Marland Kitchen... Take one tablet by mouth once a day    Lisinopril-hydrochlorothiazide 20-12.5 Mg Tabs (Lisinopril-hydrochlorothiazide) .Marland Kitchen... 1 tab by mouth daily    Warfarin Sodium 4 Mg Tabs (Warfarin sodium) .Marland Kitchen... Take 1 tablet by mouth once a day as needed  Problem # 5:  ESSENTIAL HYPERTENSION, BENIGN (ICD-401.1) Blood pressure elevated. However she is only taking hydralazine 50 mg twice daily. She also states that it typically runs 138/70 at home. I have asked her to increase her hydralazine to 50 mg p.o. t.i.d. This can be increased further as needed.  She will follow her blood pressure at home and we will increase medications as needed. Her updated medication list for this problem includes:    Coreg 25 Mg Tabs (Carvedilol) .Marland Kitchen... Take 1/2 tablet by mouth twice a day    Norvasc 10 Mg Tabs (Amlodipine besylate) .Marland Kitchen... Take one tablet by mouth once a day    Aspirin 81 Mg Tbec (Aspirin) .Marland Kitchen... Take one tablet by mouth twice ad ay    Hydralazine Hcl 50 Mg Tabs (Hydralazine hcl) .Marland Kitchen... Pt. takes two times a day only    Lisinopril-hydrochlorothiazide 20-12.5 Mg Tabs (Lisinopril-hydrochlorothiazide) .Marland Kitchen... 1 tab by mouth daily  Problem # 6:  IDDM (ICD-250.01)  Her updated medication list for this problem includes:    Aspirin 81 Mg Tbec (Aspirin) .Marland Kitchen... Take one tablet by mouth twice ad ay    Lantus Solostar 100 Unit/ml Soln (Insulin glargine) ..... Inj 25 units every evening    Humalog Pen 100 Unit/ml Soln (Insulin lispro (human)) ..... Sliding scale as directed    Lisinopril-hydrochlorothiazide  20-12.5 Mg Tabs (Lisinopril-hydrochlorothiazide) .Marland Kitchen... 1 tab by mouth daily  Problem # 7:  UNSPECIFIED HYPOTHYROIDISM (ICD-244.9)  Her updated medication list for this problem includes:    Synthroid 25 Mcg Tabs (Levothyroxine sodium) .Marland Kitchen... Take 1 tablet by mouth once a day  Problem # 8:  PHLEBITIS&THROMBOPHLEBITIS LOWER EXTREM UNSPEC (ICD-451.2) Patient's Coumadin is being monitored by primary care. The following medications were removed from the medication list:    Coumadin 5 Mg Tabs (Warfarin sodium) ..... Sunday - 4 mg, monday - 5 mg, tuesday - 4 mg, wednesday - 4 mg, thursday - 4 mg, friday - 5 mg, saturday - 5 mg    Coumadin 4 Mg Tabs (Warfarin sodium) ..... Sunday - 4 mg, monday - 5 mg, tuesday - 4 mg, wednesday - 4 mg, thursday - 4 mg, friday - 5 mg, saturday - 5 mg Her updated medication list for this problem includes:    Aspirin 81 Mg Tbec (Aspirin) .Marland Kitchen... Take one tablet by mouth twice ad ay    Plavix 75 Mg Tabs (Clopidogrel bisulfate) .Marland Kitchen... Take one tablet by mouth once a day    Warfarin Sodium 4 Mg Tabs (Warfarin sodium) .Marland Kitchen... Take 1 tablet by mouth once a day as needed  Patient Instructions: 1)  Your physician has recommended you make the following change in your medication: MAKE SURE TAKING HYDRALAZINE THREE TIMES DAILY

## 2010-03-24 ENCOUNTER — Inpatient Hospital Stay (HOSPITAL_COMMUNITY)
Admission: EM | Admit: 2010-03-24 | Discharge: 2010-03-28 | DRG: 312 | Disposition: A | Discharge status: Home Health | Payer: Medicare Other | Attending: Internal Medicine | Admitting: Internal Medicine

## 2010-03-24 ENCOUNTER — Emergency Department (HOSPITAL_COMMUNITY): Payer: Medicare Other

## 2010-03-24 ENCOUNTER — Inpatient Hospital Stay (HOSPITAL_COMMUNITY): Payer: Medicare Other

## 2010-03-24 DIAGNOSIS — Z85048 Personal history of other malignant neoplasm of rectum, rectosigmoid junction, and anus: Secondary | ICD-10-CM

## 2010-03-24 DIAGNOSIS — Z91199 Patient's noncompliance with other medical treatment and regimen due to unspecified reason: Secondary | ICD-10-CM

## 2010-03-24 DIAGNOSIS — Z9119 Patient's noncompliance with other medical treatment and regimen: Secondary | ICD-10-CM

## 2010-03-24 DIAGNOSIS — N390 Urinary tract infection, site not specified: Secondary | ICD-10-CM | POA: Diagnosis present

## 2010-03-24 DIAGNOSIS — Z853 Personal history of malignant neoplasm of breast: Secondary | ICD-10-CM

## 2010-03-24 DIAGNOSIS — R55 Syncope and collapse: Secondary | ICD-10-CM

## 2010-03-24 DIAGNOSIS — I2510 Atherosclerotic heart disease of native coronary artery without angina pectoris: Secondary | ICD-10-CM | POA: Diagnosis present

## 2010-03-24 DIAGNOSIS — I6529 Occlusion and stenosis of unspecified carotid artery: Secondary | ICD-10-CM | POA: Diagnosis present

## 2010-03-24 DIAGNOSIS — E860 Dehydration: Secondary | ICD-10-CM | POA: Diagnosis present

## 2010-03-24 DIAGNOSIS — I129 Hypertensive chronic kidney disease with stage 1 through stage 4 chronic kidney disease, or unspecified chronic kidney disease: Secondary | ICD-10-CM | POA: Diagnosis present

## 2010-03-24 DIAGNOSIS — I498 Other specified cardiac arrhythmias: Secondary | ICD-10-CM | POA: Diagnosis present

## 2010-03-24 DIAGNOSIS — I739 Peripheral vascular disease, unspecified: Secondary | ICD-10-CM | POA: Diagnosis present

## 2010-03-24 DIAGNOSIS — N184 Chronic kidney disease, stage 4 (severe): Secondary | ICD-10-CM | POA: Diagnosis present

## 2010-03-24 DIAGNOSIS — I252 Old myocardial infarction: Secondary | ICD-10-CM

## 2010-03-24 DIAGNOSIS — Z8673 Personal history of transient ischemic attack (TIA), and cerebral infarction without residual deficits: Secondary | ICD-10-CM

## 2010-03-24 DIAGNOSIS — E039 Hypothyroidism, unspecified: Secondary | ICD-10-CM | POA: Diagnosis present

## 2010-03-24 DIAGNOSIS — Z794 Long term (current) use of insulin: Secondary | ICD-10-CM

## 2010-03-24 DIAGNOSIS — E118 Type 2 diabetes mellitus with unspecified complications: Secondary | ICD-10-CM | POA: Diagnosis present

## 2010-03-24 DIAGNOSIS — IMO0002 Reserved for concepts with insufficient information to code with codable children: Secondary | ICD-10-CM | POA: Diagnosis present

## 2010-03-24 DIAGNOSIS — R040 Epistaxis: Secondary | ICD-10-CM | POA: Diagnosis present

## 2010-03-24 LAB — CK TOTAL AND CKMB (NOT AT ARMC)
CK, MB: 1.7 ng/mL (ref 0.3–4.0)
Relative Index: INVALID (ref 0.0–2.5)
Total CK: 43 U/L (ref 7–177)

## 2010-03-24 LAB — PROTIME-INR: Prothrombin Time: 12.1 seconds (ref 11.6–15.2)

## 2010-03-24 LAB — DIFFERENTIAL
Basophils Absolute: 0 10*3/uL (ref 0.0–0.1)
Basophils Relative: 0 % (ref 0–1)
Eosinophils Absolute: 0.2 10*3/uL (ref 0.0–0.7)
Lymphocytes Relative: 12 % (ref 12–46)
Lymphs Abs: 1.1 10*3/uL (ref 0.7–4.0)
Monocytes Absolute: 0.3 10*3/uL (ref 0.1–1.0)
Monocytes Relative: 4 % (ref 3–12)
Neutro Abs: 7.6 10*3/uL (ref 1.7–7.7)
Neutrophils Relative %: 82 % — ABNORMAL HIGH (ref 43–77)

## 2010-03-24 LAB — BASIC METABOLIC PANEL
BUN: 32 mg/dL — ABNORMAL HIGH (ref 6–23)
CO2: 24 mEq/L (ref 19–32)
Calcium: 9 mg/dL (ref 8.4–10.5)
Creatinine, Ser: 2.07 mg/dL — ABNORMAL HIGH (ref 0.4–1.2)
GFR calc non Af Amer: 24 mL/min — ABNORMAL LOW (ref >60)
Glucose, Bld: 369 mg/dL — ABNORMAL HIGH (ref 70–99)
Sodium: 132 mEq/L — ABNORMAL LOW (ref 135–145)

## 2010-03-24 LAB — URINALYSIS, ROUTINE W REFLEX MICROSCOPIC
Bilirubin Urine: NEGATIVE
Hgb urine dipstick: NEGATIVE
Ketones, ur: NEGATIVE mg/dL
Nitrite: NEGATIVE
Protein, ur: >300 mg/dL — AB
Specific Gravity, Urine: 1.027 (ref 1.005–1.030)
Urobilinogen, UA: 0.2 mg/dL (ref 0.0–1.0)

## 2010-03-24 LAB — HEPATIC FUNCTION PANEL
Albumin: 2.8 g/dL — ABNORMAL LOW (ref 3.5–5.2)
Alkaline Phosphatase: 100 U/L (ref 39–117)
Indirect Bilirubin: 0.5 mg/dL (ref 0.3–0.9)
Total Protein: 5.9 g/dL — ABNORMAL LOW (ref 6.0–8.3)

## 2010-03-24 LAB — CBC
HCT: 34.4 % — ABNORMAL LOW (ref 36.0–46.0)
Hemoglobin: 11.6 g/dL — ABNORMAL LOW (ref 12.0–15.0)
MCH: 27.2 pg (ref 26.0–34.0)
MCHC: 33.7 g/dL (ref 30.0–36.0)
MCV: 80.8 fL (ref 78.0–100.0)
Platelets: 193 10*3/uL (ref 150–400)
RBC: 4.26 MIL/uL (ref 3.87–5.11)
RDW: 13 % (ref 11.5–15.5)
WBC: 9.3 10*3/uL (ref 4.0–10.5)

## 2010-03-24 LAB — GLUCOSE, CAPILLARY: Glucose-Capillary: 246 mg/dL — ABNORMAL HIGH (ref 70–99)

## 2010-03-24 LAB — D-DIMER, QUANTITATIVE: D-Dimer, Quant: 0.58 ug/mL-FEU — ABNORMAL HIGH (ref 0.00–0.48)

## 2010-03-24 LAB — TROPONIN I: Troponin I: 0.1 ng/mL — ABNORMAL HIGH (ref 0.00–0.06)

## 2010-03-24 LAB — POCT CARDIAC MARKERS
CKMB, poc: 1.1 ng/mL (ref 1.0–8.0)
Myoglobin, poc: 158 ng/mL (ref 12–200)

## 2010-03-25 ENCOUNTER — Inpatient Hospital Stay (HOSPITAL_COMMUNITY): Payer: Medicare Other

## 2010-03-25 DIAGNOSIS — R55 Syncope and collapse: Secondary | ICD-10-CM

## 2010-03-25 LAB — BASIC METABOLIC PANEL
CO2: 22 mEq/L (ref 19–32)
Chloride: 103 mEq/L (ref 96–112)
Creatinine, Ser: 2.45 mg/dL — ABNORMAL HIGH (ref 0.4–1.2)
GFR calc Af Amer: 24 mL/min — ABNORMAL LOW (ref >60)
Glucose, Bld: 363 mg/dL — ABNORMAL HIGH (ref 70–99)
Sodium: 132 mEq/L — ABNORMAL LOW (ref 135–145)

## 2010-03-25 LAB — GLUCOSE, CAPILLARY
Glucose-Capillary: 377 mg/dL — ABNORMAL HIGH (ref 70–99)
Glucose-Capillary: 243 mg/dL — ABNORMAL HIGH (ref 70–99)

## 2010-03-25 LAB — CBC
HCT: 30.8 % — ABNORMAL LOW (ref 36.0–46.0)
Hemoglobin: 10.4 g/dL — ABNORMAL LOW (ref 12.0–15.0)
MCH: 27.4 pg (ref 26.0–34.0)
MCHC: 33.8 g/dL (ref 30.0–36.0)
RBC: 3.79 MIL/uL — ABNORMAL LOW (ref 3.87–5.11)

## 2010-03-25 LAB — HEMOGLOBIN A1C: Hgb A1c MFr Bld: 12.8 % — ABNORMAL HIGH (ref <5.7)

## 2010-03-25 LAB — LIPID PANEL
Cholesterol: 233 mg/dL — ABNORMAL HIGH (ref 0–200)
LDL Cholesterol: 120 mg/dL — ABNORMAL HIGH (ref 0–99)
Triglycerides: 325 mg/dL — ABNORMAL HIGH (ref <150)

## 2010-03-25 LAB — CARDIAC PANEL(CRET KIN+CKTOT+MB+TROPI)
CK, MB: 1 ng/mL (ref 0.3–4.0)
Relative Index: INVALID (ref 0.0–2.5)
Total CK: 17 U/L (ref 7–177)
Total CK: 21 U/L (ref 7–177)
Troponin I: 0.07 ng/mL — ABNORMAL HIGH (ref 0.00–0.06)

## 2010-03-25 LAB — TSH: TSH: 6.099 u[IU]/mL — ABNORMAL HIGH (ref 0.350–4.500)

## 2010-03-26 LAB — GLUCOSE, CAPILLARY
Glucose-Capillary: 323 mg/dL — ABNORMAL HIGH (ref 70–99)
Glucose-Capillary: 251 mg/dL — ABNORMAL HIGH (ref 70–99)
Glucose-Capillary: 205 mg/dL — ABNORMAL HIGH (ref 70–99)

## 2010-03-26 LAB — CBC
MCH: 27.4 pg (ref 26.0–34.0)
Platelets: 203 10*3/uL (ref 150–400)
RBC: 3.94 MIL/uL (ref 3.87–5.11)
RDW: 13.3 % (ref 11.5–15.5)
WBC: 7.1 10*3/uL (ref 4.0–10.5)

## 2010-03-26 LAB — BASIC METABOLIC PANEL
Chloride: 103 mEq/L (ref 96–112)
Creatinine, Ser: 2.82 mg/dL — ABNORMAL HIGH (ref 0.4–1.2)
GFR calc Af Amer: 20 mL/min — ABNORMAL LOW (ref >60)
GFR calc non Af Amer: 17 mL/min — ABNORMAL LOW (ref >60)

## 2010-03-27 DIAGNOSIS — R55 Syncope and collapse: Secondary | ICD-10-CM

## 2010-03-27 LAB — BASIC METABOLIC PANEL
BUN: 46 mg/dL — ABNORMAL HIGH (ref 6–23)
CO2: 21 mEq/L (ref 19–32)
Chloride: 106 mEq/L (ref 96–112)
Glucose, Bld: 225 mg/dL — ABNORMAL HIGH (ref 70–99)
Potassium: 4.6 mEq/L (ref 3.5–5.1)
Sodium: 132 mEq/L — ABNORMAL LOW (ref 135–145)

## 2010-03-27 LAB — GLUCOSE, CAPILLARY
Glucose-Capillary: 189 mg/dL — ABNORMAL HIGH (ref 70–99)
Glucose-Capillary: 162 mg/dL — ABNORMAL HIGH (ref 70–99)

## 2010-03-27 LAB — URINE CULTURE

## 2010-03-28 ENCOUNTER — Inpatient Hospital Stay (HOSPITAL_COMMUNITY): Payer: Medicare Other

## 2010-03-28 DIAGNOSIS — I517 Cardiomegaly: Secondary | ICD-10-CM

## 2010-03-28 LAB — GLUCOSE, CAPILLARY: Glucose-Capillary: 170 mg/dL — ABNORMAL HIGH (ref 70–99)

## 2010-03-28 LAB — BASIC METABOLIC PANEL
BUN: 39 mg/dL — ABNORMAL HIGH (ref 6–23)
CO2: 17 mEq/L — ABNORMAL LOW (ref 19–32)
Calcium: 8.1 mg/dL — ABNORMAL LOW (ref 8.4–10.5)
GFR calc non Af Amer: 21 mL/min — ABNORMAL LOW (ref >60)
Glucose, Bld: 143 mg/dL — ABNORMAL HIGH (ref 70–99)

## 2010-03-28 MED ORDER — TECHNETIUM TO 99M ALBUMIN AGGREGATED
6.0000 | Freq: Once | INTRAVENOUS | Status: AC | PRN
  Administered 2010-03-28: 6 via INTRAVENOUS

## 2010-03-28 MED ORDER — XENON XE 133 GAS
10.0000 | GAS_FOR_INHALATION | Freq: Once | RESPIRATORY_TRACT | Status: AC | PRN
  Administered 2010-03-28: 10 via RESPIRATORY_TRACT

## 2010-03-29 ENCOUNTER — Telehealth: Payer: Self-pay | Admitting: Cardiology

## 2010-03-29 NOTE — Discharge Summary (Addendum)
[name], SAUVE NO.:  [id number]  MEDICAL RECORD NO.:  [id number]           PATIENT TYPE:  I  LOCATION:  2036                         FACILITY:  [facility]  PHYSICIAN:  Madolyn Frieze. Jens Som, MD, FACCDATE OF BIRTH:  16-Nov-1940  DATE OF ADMISSION:  03/24/2010 DATE OF DISCHARGE:  03/28/2010                              DISCHARGE SUMMARY   DISCHARGE DIAGNOSES: 1. Syncope, felt most likely vagally mediated in the setting of     bradycardia, urinary tract infection, dehydration.     a.     MRI of the brain, March 25, 2010, showed remote inferior      right cerebellar infarct with encephalomalacia, along the anterior      margin there is a small acute infarct.  Tiny acute infarct in      anterior right parietal lobe, felt unlikely to be small metastatic      lesion but possibility of intracranial metastatic disease cannot      be addressed as the patient cannot receive contrast.  This is felt      less likely contributing to her syncope.     b.     Coreg and clonidine discontinued this admission. 2. Peripheral vascular disease with carotid Dopplers this admission     showed 40-59% right internal carotid artery stenosis with no left     internal carotid artery stenosis, antegrade flow. 3. Acute-on-chronic renal insufficiency with creatinine peak of 2.82,     discharge creatinine 2.27. 4. Uncontrolled insulin-dependent type 2 diabetes, for outpatient     followup, A1c 12.8. 5. Hypertension. 6. Coronary artery disease.     a.     Status post multiple myocardial infarction with multiple      stents in 2005 in [place] and [place].     b.     Last intervention in December 2011, status post drug-eluting      stent to the right coronary artery and percutaneous transluminal      coronary angioplasty to left anterior descending.     c.     Normal ejection fraction by echo this admission with no      evidence for thrombus. 7. Breast cancer status post lumpectomy. 8.  Dyslipidemia, changed to Lipitor for cost reason, with total     cholesterol 233, triglycerides  325, HDL 48, LDL 120.  LDL is     subgoal, but there is also question of noncompliance.     a.     The patient should have recheck of fasting lipid, we will      change to Lipitor and encourage compliance as an outpatient. 9. Noncompliant. 10.Cerebrovascular accident in 2009. 11.Rectal cancer status post radiation and surgery in 2001. 12.Hypothyroidism. 13.Deep venous thrombosis, diagnosed in July 2011 with Coumadin     recently stopped per primary care provider, deep venous thrombosis     was felt provoked by long car ride and she was on Coumadin for 6     months. 14.Epistaxis while taking aspirin, Plavix, and Coumadin combine. 15.Status post cholecystectomy. 16.Status post appendectomy. 17.Status post ankle fracture repair.  HOSPITAL  COURSE:  Ms. Moultry is a 70 year old female with a past medical history that includes CAD, DVT, renal insufficiency, insulin-dependent diabetes and noncompliance who was admitted to the hospital with an episode of syncope.  Initial CT of the head demonstrated no acute intracranial abnormality.  No acute cortical infarction.  There is encephalomalacia, probably due to prior infarct in the right cerebellum medially.  Followup MRI of the brain demonstrated the above findings with a tiny acute infarct in the anterior right parietal lobe and small acute infarct along the anterior margin.  Her D-dimer was also mildly positive at 0.58 although the patient was not tachypneic, tachycardic, or hypoxic, so this was felt less likely an etiology for her syncope. She was noted to be bradycardic, and her clonidine was slowly decreased throughout her hospitalization.  Her Coreg was also decreased as well. The findings of the stroke were felt less likely contributing to the syncopal event.  Dr. Daleen Squibb suggested this was secondary to dehydration in the setting of her urinary  tract infection which was diagnosed as well, as well as her bradycardia.  Urinary tract infection was pertinent for Klebsiella pneumoniae, and she was started on ciprofloxacin.  She did experience some hypertension with the weaning of the clonidine, so her hydralazine was up titrated.  Carotid Dopplers were done which found the above findings.  Dr. Jens Som saw the patient yesterday and today and feels that the syncope may have been vagally mediated as it occurred with nausea.  Her Coreg was discontinued completely.  Plans also to wean the Coumadin off.  The patient was continued on aspirin and Plavix was resumed.  She was seen by case management for referral for possible coupon systems for assistance with medications and compliance was stressed.  Dr. Jens Som has seen and examined her today and feels she is stable for discharge.  Medications upon his recommendation at discharge are to follow.  He would also like the patient to get a 48-hour Holter monitor as an outpatient when she is completely off the clonidine which will be arranged.  A 2-D echocardiogram was performed today and personally read by Dr. Jens Som which found normal LV function and no evidence of thrombus.  DISCHARGE LABORATORY DATA:  WBC is 7.1, hemoglobin 10.8, hematocrit 32.3.  Sodium 136, potassium 4, chloride 113, CO2 of 17, glucose 143, BUN 39, creatinine 2.27.  LFTs were within normal limits with the exception of decreased total protein 5.9 and a albumin of 2.8. Hemoglobin A1c is 12.8.  Cardiac enzymes demonstrated a troponin leak of 0.10, then 0.07, then 0.07 with negative CKs and MBs.  Total cholesterol 233, triglycerides 325, HDL 48, LDL 120.  TSH was 6.099 but free T4 and T4 were normal.  The UA showed a Klebsiella pneumoniae urinary tract infection which was resistant to ampicillin, intermediate resistance to nitrofurantoin, but otherwise pansensitive.  STUDIES: 1. CT of the head on March 24, 2010, showed no  acute intracranial     abnormality.  No acute cortical infarction.  Encephalomalacia is     probably due to prior infarct in right cerebellum medially. 2. Chest x-ray, March 24, 2010, showed cardiomegaly without congestive     failure.  Retrocardiac left lower lobe opacity, favoring     atelectasis.  Infections cannot be excluded. 3. MRI of the brain, March 25, 2010, without contrast showed remote     inferior right cerebellar infarct with encephalomalacia.  Along the     anterior margin, there is a small acute infarct.  Tiny acute     infarct in anterior right parietal lobe.  This was felt unlikely to     be a small metastatic lesion.  Possibility of intracranial     metastatic disease cannot be addressed as the patient cannot     receive contrast.  No obvious intracranial mass lesion was detected     on the unenhanced exam.  Poor delineation of the right vertebral     artery. 4. A 2-D echocardiogram with preliminary results showing EF of 55-60%     with hypokinesis of the basal posterolateral myocardium, high     ventricular filling pressure, and no obvious thrombus.  DISCHARGE MEDICATIONS: 1. Ciprofloxacin 500 mg daily, with 4 more days to treat a 7-day     course for her urinary tract infection. 2. Clonidine 0.1 mg, will take one tablet twice a day on March 28, 2010, and March 29, 2010.  On March 30, 2010, and March 31, 2010,     she will decrease it to one tablet once a day and then discontinue     completely. 3. Lipitor 20 mg nightly. 4. Hydralazine 50 mg t.i.d. with instructions for close eye on her     blood pressure while coming off the clonidine.  She is instructed     to contact Dr. Ludwig Clarks office if her blood pressure persists     higher than 140 systolic as her hydralazine may need to be up     titrated. 5. Aspirin 81 mg daily. 6. Plavix 75 mg daily. 7. Humalog insulin 5-8 units t.i.d. with meals. 8. Lantus 24 units every morning with instructions to follow up  with     her primary care provider for further adjustment of insulin given     her abnormal A1c. 9. Levothyroxine 50 mcg daily before breakfast. 10.Norvasc 10 mg daily.  DISPOSITION:  Ms. Battaglia will be discharged in stable condition home. She is instructed to increase activity slowly, may walk up steps and may shower and bathe.  She is to follow a low-sodium heart-healthy diabetic diet.  She will see Dr. Jens Som on April 12, 2010, at 3:00 p.m.  She will also be contacted by his office to arrange for a 48-hour Holter monitor.  She is to complete her last dose of clonidine on April 02, 2010, so this will be arranged for later next week.  She is also instructed to follow up with her primary care provider to discuss her diabetes medicine adjustment within 1 week given her high A1c.  She is also instructed to follow up with Neurology in two weeks.  DURATION OF DISCHARGE ENCOUNTER:  One hour including physician and PA time.     Dayna Dunn, P.A.C.   ______________________________ Madolyn Frieze. Jens Som, MD, [facility]    DD/MEDQ  D:  03/28/2010  T:  03/29/2010  Job:  [id number]  cc:   [facility] in [place]  Electronically Signed by Ronie Spies  on 03/29/2010 10:15:47 PM Electronically Signed by Olga Millers MD [facility] on 04/02/2010 08:24:13 AM

## 2010-03-29 NOTE — H&P (Addendum)
NAMECARYLE, [name] NO.:  [id number]  MEDICAL RECORD NO.:  [id number]           PATIENT TYPE:  I  LOCATION:  2036                         FACILITY:  [facility]  PHYSICIAN:  Pricilla Riffle, MD, FACCDATE OF BIRTH:  1940-12-29  DATE OF ADMISSION:  03/24/2010 DATE OF DISCHARGE:                             HISTORY & PHYSICAL   PRIMARY CARDIOLOGIST:  Madolyn Frieze. Jens Som, MD, [facility]  PRIMARY CARE DOCTOR:  Please see Dr. Orson Slick, but the patient wants to change to [facility] in [place].  CHIEF COMPLAINT:  Passed out.  HOSPITAL COURSE:  [name] is a 70 year old female with past medical history that includes CAD, status post multiple stent placements in [place] and [place] with most recent intervention in [place] with DES to the RCA and PTCA of the LAD in December 20, 2009.  She also has a history of breast cancer and rectal cancer, stage IV chronic kidney disease, and DVT.  She has been feeling weak entirely for the last few days, staying in bed a lot.  She ate breakfast of oatmeal this morning and her daughter picked her up.  While driving, she complained of nausea.  She took a mint out of her purse, and then had slurred speech and mumbling.  The daughter said are you okay and the patient had went down and she lost consciousness.  The daughter shook her and the patient said I am okay without slurring, they went out again.  She awoke with shaking again and the daughter stopped the car.  She was brought to the ER, where CT of the head showed no acute abnormalities, but has encephalomalacia probably due to prior injury to the right cerebellar. On telemetry, she has had no signs of bradycardia with heart rates in the 40s, but upon review of her records, this is baseline for her. Currently, she complains of nausea, headache behind her right eye, and in general feeling funny.  She does not remember the spells in the car ride.  The daughter said  transiently her right face looks swollen in the car.  PAST MEDICAL HISTORY: 1. CAD.     a.     Status post multiple MI with multiple stents x5 in [place].     b.     Last intervention December 2011.     c.     Status post DES to the RCA and PTCA to the LAD.     d.     Normal EF by echo of 60-69%, September 2011. 2. Hypertension. 3. Type 2 diabetes, insulin-dependent. 4. Breast cancer, status post lumpectomy. 5. Dyslipidemia. 6. Rectal cancer, status post radiation from surgery in 2001. 7. CVA in 2009. 8. Procedure for chronic kidney disease.  Last creatinine 3.21,     October 2011. 9. Hypothyroidism. 10.DVT diagnosed in July 2011, with an Coumadin until recently stopped     by her primary care provider.  The DVT was felt provoked by long     car ride and she was on Coumadin for 6 months. 11.Epistaxis  while taking aspirin, Plavix and Coumadin combined. 12.Status post cholecystectomy. 13.Status post appendectomy. 14.Status post ankle fracture repair.  MEDICATIONS: 1. Prevacid 30 mg daily. 2. Apresoline 25 mg t.i.d. 3. Coreg 3.125 mg b.i.d. 4. Clonidine 0.3 mg t.i.d. 5. Aromasin 25 mg daily. 6. Hydrochlorothiazide 25 mg daily. 7. Norvasc 10 mg daily. 8. Aspirin 162 mg daily. 9. Lantus 24 units nightly. 10.Zocor 40 mg nightly. 11.Vitamin C 500 mg daily.  Here in the ER, the patient received aspirin, Zofran, moxifloxacin at 400 and morphine.  ALLERGIES:  No known drug allergies.  SOCIAL HISTORY:  The patient is separated from her husband.  She has 3 children.  She denies any tobacco abuse.  She drinks rare alcohol.  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  FAMILY HISTORY:  Positive for coronary artery disease, diabetes and lung disease.  REVIEW OF SYSTEMS:  Negative for fever and chills.  Positive for cold intolerance as well as recurrent headaches.  Negative for chest pain or shortness of breath.  She stopped Zoloft 2 months ago and states she  has been feeling better, but the daughter notes that her mood has been worse.  Positive for nausea and vomiting.  No diarrhea.  No urinary urgency, but positive for dysuria.  All other systems are reviewed and otherwise negative.  LABORATORY DATA:  WBC 9.3, hemoglobin 11.6, hematocrit 34.4, platelet count of 93.  Sodium 132, potassium 4.8, chloride 101, CO2 24, glucose 369, BUN 32, creatinine 2.07.  Cardiac enzymes negative x1.  INR 0.88.  STUDIES: 1. Chest x-ray March 24, 2010, shows cardiomegaly without congestive     failure.  Retrocardiac left lower lobe opacity.  There is     atelectasis.  Early infection cannot be excluded. 2. CT of the head without contrast March 24, 2010, showed no acute     intracranial abnormalities.  No acute cortical infarction.     Encephalomalacia probably due to prior infarct in the right     cerebellum medially.  EKG:  Sinus bradycardia at the rate of 46 with PACs, subtle ST elevation, V2 through V5 with biphasic T-waves III and aVF.  PHYSICAL EXAMINATION:  VITAL SIGNS:  Temperature 97.9, pulse 44, respirations 16, blood pressure 150/56, pulse ox 97% on room air, then put on 2 L and is now 99%. GENERAL:  [name] is a tired-appearing female, in no acute distress. HEENT:  Normocephalic, atraumatic with extraocular movements intact. Clear sclerae.  Nares are without discharge. NECK:  Supple without carotid bruit or JVD. HEART:  Auscultation of the heart reveals regular rhythm, bradycardic, S1 and S2 audible with no S3 or S4.  No murmurs, rubs or gallops. LUNGS:  Were clear to auscultation bilaterally. ABDOMEN:  Soft with normoactive bowel sounds.  No hepatosplenomegaly. Colostomy site is dry. EXTREMITIES:  Warm and dry without edema.  Her left hand is cold.  She has 2+ pedal pulses. NEURO:  Nonfocal, except for the fact that the patient is somewhat tired appearing.  She does not follow any events at the car.  ASSESSMENT AND PLAN:  The patient was seen  and examined by Dr. Dietrich Pates in the ER.  This is a 70 year old female with the history of CAD as outlined above, diabetes, breast and rectal cancer, stage IV kidney disease, DVT in July 2011 as well as prior CVA who presents with an episode of slurred speech and syncope x2.  The patient does not recall the incident, post syncope.  She has a residual right-sided headache and overall fatigue.  Neuro exam is nonfocal and her EKG represents sinus bradycardia with heart rates in the 40s, but the bradycardia is chronic for the patient.  CT of the head demonstrates an old cerebellar stroke without acute infarct.  Etiology of her syncope is unclear.  At this time, we will admit the patient to the hospital and rule out myocardial infarction, in the event that this may have led to a possible arrhythmia prior.  However, her EF was normal recently September 2011.  Dr. Tenny Craw discussed the case with Neurology who will hold off on seeing the patient for now, but they recommended MRI and to continue aspirin.  They say if they would not treat this any differently and if it was a seizure then that they would await a second events anyway.  We will also check carotid Dopplers.  The patient will be kept on the same medications including her beta-blocker and clonidine for now, to observe her on this regimen before switching to delineate if this was possible cause for her syncope.  Thyroid function tests will be checked along with D-dimer given her history of DVT.  Her Zocor will be changed to Crestor in accordance with recommendations for less chance of interaction with her Norvasc.  We will follow her hypertension on her current medications and make adjustments as needed.     Ronie Spies, P.A.C.   ______________________________ Pricilla Riffle, MD, [facility]    DD/MEDQ  D:  03/24/2010  T:  03/25/2010  Job:  [id number]  cc:   Madolyn Frieze. Jens Som, MD, [facility]  Electronically Signed by  Ronie Spies  on 03/29/2010 10:15:22 PM Electronically Signed by Dietrich Pates MD [address] [facility] on 05/06/2010 10:08:55 PM

## 2010-03-30 LAB — PROTIME-INR
INR: 1.36 (ref 0.00–1.49)
INR: 1.45 (ref 0.00–1.49)
INR: 2.41 — ABNORMAL HIGH (ref 0.00–1.49)
INR: 2.55 — ABNORMAL HIGH (ref 0.00–1.49)
INR: 2.85 — ABNORMAL HIGH (ref 0.00–1.49)
INR: 2.96 — ABNORMAL HIGH (ref 0.00–1.49)
INR: 2.93 — ABNORMAL HIGH (ref 0.00–1.49)
Prothrombin Time: 17 seconds — ABNORMAL HIGH (ref 11.6–15.2)
Prothrombin Time: 17.8 seconds — ABNORMAL HIGH (ref 11.6–15.2)
Prothrombin Time: 26.4 seconds — ABNORMAL HIGH (ref 11.6–15.2)
Prothrombin Time: 27.5 seconds — ABNORMAL HIGH (ref 11.6–15.2)
Prothrombin Time: 28.2 seconds — ABNORMAL HIGH (ref 11.6–15.2)
Prothrombin Time: 30 seconds — ABNORMAL HIGH (ref 11.6–15.2)
Prothrombin Time: 30.9 seconds — ABNORMAL HIGH (ref 11.6–15.2)
Prothrombin Time: 29.6 seconds — ABNORMAL HIGH (ref 11.6–15.2)
Prothrombin Time: 30.6 seconds — ABNORMAL HIGH (ref 11.6–15.2)

## 2010-03-30 LAB — CBC
HCT: 26.4 % — ABNORMAL LOW (ref 36.0–46.0)
HCT: 28.8 % — ABNORMAL LOW (ref 36.0–46.0)
HCT: 30.3 % — ABNORMAL LOW (ref 36.0–46.0)
HCT: 30.4 % — ABNORMAL LOW (ref 36.0–46.0)
HCT: 31.3 % — ABNORMAL LOW (ref 36.0–46.0)
HCT: 31.8 % — ABNORMAL LOW (ref 36.0–46.0)
HCT: 29.9 % — ABNORMAL LOW (ref 36.0–46.0)
HCT: 31.2 % — ABNORMAL LOW (ref 36.0–46.0)
Hemoglobin: 10.4 g/dL — ABNORMAL LOW (ref 12.0–15.0)
Hemoglobin: 10.4 g/dL — ABNORMAL LOW (ref 12.0–15.0)
Hemoglobin: 10.4 g/dL — ABNORMAL LOW (ref 12.0–15.0)
Hemoglobin: 8.7 g/dL — ABNORMAL LOW (ref 12.0–15.0)
Hemoglobin: 8.9 g/dL — ABNORMAL LOW (ref 12.0–15.0)
Hemoglobin: 9 g/dL — ABNORMAL LOW (ref 12.0–15.0)
Hemoglobin: 9.9 g/dL — ABNORMAL LOW (ref 12.0–15.0)
Hemoglobin: 10.7 g/dL — ABNORMAL LOW (ref 12.0–15.0)
Hemoglobin: 9.9 g/dL — ABNORMAL LOW (ref 12.0–15.0)
MCH: 28.2 pg (ref 26.0–34.0)
MCH: 28.8 pg (ref 26.0–34.0)
MCH: 28.9 pg (ref 26.0–34.0)
MCH: 29.1 pg (ref 26.0–34.0)
MCH: 29.5 pg (ref 26.0–34.0)
MCH: 29.6 pg (ref 26.0–34.0)
MCH: 29.6 pg (ref 26.0–34.0)
MCH: 29.8 pg (ref 26.0–34.0)
MCHC: 32.6 g/dL (ref 30.0–36.0)
MCHC: 32.7 g/dL (ref 30.0–36.0)
MCHC: 32.8 g/dL (ref 30.0–36.0)
MCHC: 32.9 g/dL (ref 30.0–36.0)
MCHC: 33.7 g/dL (ref 30.0–36.0)
MCHC: 34.2 g/dL (ref 30.0–36.0)
MCHC: 34.3 g/dL (ref 30.0–36.0)
MCHC: 34.4 g/dL (ref 30.0–36.0)
MCV: 84.2 fL (ref 78.0–100.0)
MCV: 86.2 fL (ref 78.0–100.0)
MCV: 88.2 fL (ref 78.0–100.0)
MCV: 90.4 fL (ref 78.0–100.0)
MCV: 86.2 fL (ref 78.0–100.0)
MCV: 86.4 fL (ref 78.0–100.0)
Platelets: 155 10*3/uL (ref 150–400)
Platelets: 158 10*3/uL (ref 150–400)
Platelets: 179 10*3/uL (ref 150–400)
Platelets: 182 10*3/uL (ref 150–400)
Platelets: 183 10*3/uL (ref 150–400)
Platelets: 184 10*3/uL (ref 150–400)
Platelets: 150 10*3/uL (ref 150–400)
Platelets: 176 10*3/uL (ref 150–400)
Platelets: 189 10*3/uL (ref 150–400)
RBC: 2.95 MIL/uL — ABNORMAL LOW (ref 3.87–5.11)
RBC: 2.98 MIL/uL — ABNORMAL LOW (ref 3.87–5.11)
RBC: 3.06 MIL/uL — ABNORMAL LOW (ref 3.87–5.11)
RBC: 3.5 MIL/uL — ABNORMAL LOW (ref 3.87–5.11)
RBC: 3.6 MIL/uL — ABNORMAL LOW (ref 3.87–5.11)
RBC: 3.69 MIL/uL — ABNORMAL LOW (ref 3.87–5.11)
RBC: 3.34 MIL/uL — ABNORMAL LOW (ref 3.87–5.11)
RBC: 3.62 MIL/uL — ABNORMAL LOW (ref 3.87–5.11)
RDW: 13.3 % (ref 11.5–15.5)
RDW: 13.8 % (ref 11.5–15.5)
RDW: 13.9 % (ref 11.5–15.5)
RDW: 14.3 % (ref 11.5–15.5)
RDW: 14.4 % (ref 11.5–15.5)
RDW: 14.4 % (ref 11.5–15.5)
RDW: 13.3 % (ref 11.5–15.5)
WBC: 6.6 10*3/uL (ref 4.0–10.5)
WBC: 6.7 10*3/uL (ref 4.0–10.5)
WBC: 6.9 10*3/uL (ref 4.0–10.5)
WBC: 7.3 10*3/uL (ref 4.0–10.5)
WBC: 7.8 10*3/uL (ref 4.0–10.5)
WBC: 8.7 10*3/uL (ref 4.0–10.5)
WBC: 5.4 10*3/uL (ref 4.0–10.5)
WBC: 5.9 10*3/uL (ref 4.0–10.5)
WBC: 6 10*3/uL (ref 4.0–10.5)

## 2010-03-30 LAB — GLUCOSE, CAPILLARY
Glucose-Capillary: 105 mg/dL — ABNORMAL HIGH (ref 70–99)
Glucose-Capillary: 116 mg/dL — ABNORMAL HIGH (ref 70–99)
Glucose-Capillary: 121 mg/dL — ABNORMAL HIGH (ref 70–99)
Glucose-Capillary: 129 mg/dL — ABNORMAL HIGH (ref 70–99)
Glucose-Capillary: 133 mg/dL — ABNORMAL HIGH (ref 70–99)
Glucose-Capillary: 135 mg/dL — ABNORMAL HIGH (ref 70–99)
Glucose-Capillary: 140 mg/dL — ABNORMAL HIGH (ref 70–99)
Glucose-Capillary: 144 mg/dL — ABNORMAL HIGH (ref 70–99)
Glucose-Capillary: 153 mg/dL — ABNORMAL HIGH (ref 70–99)
Glucose-Capillary: 158 mg/dL — ABNORMAL HIGH (ref 70–99)
Glucose-Capillary: 160 mg/dL — ABNORMAL HIGH (ref 70–99)
Glucose-Capillary: 175 mg/dL — ABNORMAL HIGH (ref 70–99)
Glucose-Capillary: 175 mg/dL — ABNORMAL HIGH (ref 70–99)
Glucose-Capillary: 176 mg/dL — ABNORMAL HIGH (ref 70–99)
Glucose-Capillary: 176 mg/dL — ABNORMAL HIGH (ref 70–99)
Glucose-Capillary: 194 mg/dL — ABNORMAL HIGH (ref 70–99)
Glucose-Capillary: 199 mg/dL — ABNORMAL HIGH (ref 70–99)
Glucose-Capillary: 213 mg/dL — ABNORMAL HIGH (ref 70–99)
Glucose-Capillary: 215 mg/dL — ABNORMAL HIGH (ref 70–99)
Glucose-Capillary: 231 mg/dL — ABNORMAL HIGH (ref 70–99)
Glucose-Capillary: 248 mg/dL — ABNORMAL HIGH (ref 70–99)
Glucose-Capillary: 251 mg/dL — ABNORMAL HIGH (ref 70–99)
Glucose-Capillary: 259 mg/dL — ABNORMAL HIGH (ref 70–99)
Glucose-Capillary: 260 mg/dL — ABNORMAL HIGH (ref 70–99)
Glucose-Capillary: 296 mg/dL — ABNORMAL HIGH (ref 70–99)
Glucose-Capillary: 154 mg/dL — ABNORMAL HIGH (ref 70–99)
Glucose-Capillary: 206 mg/dL — ABNORMAL HIGH (ref 70–99)
Glucose-Capillary: 258 mg/dL — ABNORMAL HIGH (ref 70–99)
Glucose-Capillary: 330 mg/dL — ABNORMAL HIGH (ref 70–99)
Glucose-Capillary: 337 mg/dL — ABNORMAL HIGH (ref 70–99)

## 2010-03-30 LAB — BASIC METABOLIC PANEL
BUN: 22 mg/dL (ref 6–23)
BUN: 30 mg/dL — ABNORMAL HIGH (ref 6–23)
BUN: 36 mg/dL — ABNORMAL HIGH (ref 6–23)
BUN: 46 mg/dL — ABNORMAL HIGH (ref 6–23)
BUN: 47 mg/dL — ABNORMAL HIGH (ref 6–23)
CO2: 18 mEq/L — ABNORMAL LOW (ref 19–32)
CO2: 18 mEq/L — ABNORMAL LOW (ref 19–32)
CO2: 20 mEq/L (ref 19–32)
CO2: 20 mEq/L (ref 19–32)
CO2: 20 mEq/L (ref 19–32)
CO2: 21 mEq/L (ref 19–32)
CO2: 22 mEq/L (ref 19–32)
CO2: 20 mEq/L (ref 19–32)
CO2: 22 mEq/L (ref 19–32)
Calcium: 8.3 mg/dL — ABNORMAL LOW (ref 8.4–10.5)
Calcium: 8.3 mg/dL — ABNORMAL LOW (ref 8.4–10.5)
Calcium: 8.5 mg/dL (ref 8.4–10.5)
Calcium: 8.5 mg/dL (ref 8.4–10.5)
Calcium: 8.6 mg/dL (ref 8.4–10.5)
Calcium: 8.6 mg/dL (ref 8.4–10.5)
Calcium: 8.7 mg/dL (ref 8.4–10.5)
Calcium: 8.9 mg/dL (ref 8.4–10.5)
Calcium: 8.2 mg/dL — ABNORMAL LOW (ref 8.4–10.5)
Calcium: 8.8 mg/dL (ref 8.4–10.5)
Chloride: 108 mEq/L (ref 96–112)
Chloride: 111 mEq/L (ref 96–112)
Chloride: 113 mEq/L — ABNORMAL HIGH (ref 96–112)
Chloride: 114 mEq/L — ABNORMAL HIGH (ref 96–112)
Chloride: 115 mEq/L — ABNORMAL HIGH (ref 96–112)
Chloride: 106 mEq/L (ref 96–112)
Chloride: 110 mEq/L (ref 96–112)
Chloride: 110 mEq/L (ref 96–112)
Creatinine, Ser: 1.71 mg/dL — ABNORMAL HIGH (ref 0.4–1.2)
Creatinine, Ser: 1.86 mg/dL — ABNORMAL HIGH (ref 0.4–1.2)
Creatinine, Ser: 2.19 mg/dL — ABNORMAL HIGH (ref 0.4–1.2)
Creatinine, Ser: 3.21 mg/dL — ABNORMAL HIGH (ref 0.4–1.2)
Creatinine, Ser: 3.38 mg/dL — ABNORMAL HIGH (ref 0.4–1.2)
Creatinine, Ser: 3.5 mg/dL — ABNORMAL HIGH (ref 0.4–1.2)
GFR calc Af Amer: 16 mL/min — ABNORMAL LOW (ref >60)
GFR calc Af Amer: 17 mL/min — ABNORMAL LOW (ref >60)
GFR calc Af Amer: 17 mL/min — ABNORMAL LOW (ref >60)
GFR calc Af Amer: 29 mL/min — ABNORMAL LOW (ref >60)
GFR calc Af Amer: 32 mL/min — ABNORMAL LOW (ref >60)
GFR calc Af Amer: 35 mL/min — ABNORMAL LOW (ref >60)
GFR calc Af Amer: 32 mL/min — ABNORMAL LOW (ref >60)
GFR calc Af Amer: 34 mL/min — ABNORMAL LOW (ref >60)
GFR calc non Af Amer: 14 mL/min — ABNORMAL LOW (ref >60)
GFR calc non Af Amer: 24 mL/min — ABNORMAL LOW (ref >60)
GFR calc non Af Amer: 27 mL/min — ABNORMAL LOW (ref >60)
GFR calc non Af Amer: 27 mL/min — ABNORMAL LOW (ref >60)
GFR calc non Af Amer: 30 mL/min — ABNORMAL LOW (ref >60)
GFR calc non Af Amer: 27 mL/min — ABNORMAL LOW (ref >60)
Glucose, Bld: 134 mg/dL — ABNORMAL HIGH (ref 70–99)
Glucose, Bld: 148 mg/dL — ABNORMAL HIGH (ref 70–99)
Glucose, Bld: 186 mg/dL — ABNORMAL HIGH (ref 70–99)
Glucose, Bld: 208 mg/dL — ABNORMAL HIGH (ref 70–99)
Glucose, Bld: 209 mg/dL — ABNORMAL HIGH (ref 70–99)
Glucose, Bld: 233 mg/dL — ABNORMAL HIGH (ref 70–99)
Glucose, Bld: 298 mg/dL — ABNORMAL HIGH (ref 70–99)
Glucose, Bld: 315 mg/dL — ABNORMAL HIGH (ref 70–99)
Potassium: 4.2 mEq/L (ref 3.5–5.1)
Potassium: 4.3 mEq/L (ref 3.5–5.1)
Potassium: 4.3 mEq/L (ref 3.5–5.1)
Potassium: 4.4 mEq/L (ref 3.5–5.1)
Potassium: 4.5 mEq/L (ref 3.5–5.1)
Potassium: 4.6 mEq/L (ref 3.5–5.1)
Potassium: 4.7 mEq/L (ref 3.5–5.1)
Potassium: 5 mEq/L (ref 3.5–5.1)
Potassium: 5.6 mEq/L — ABNORMAL HIGH (ref 3.5–5.1)
Sodium: 132 mEq/L — ABNORMAL LOW (ref 135–145)
Sodium: 138 mEq/L (ref 135–145)
Sodium: 138 mEq/L (ref 135–145)
Sodium: 139 mEq/L (ref 135–145)
Sodium: 139 mEq/L (ref 135–145)
Sodium: 140 mEq/L (ref 135–145)
Sodium: 134 mEq/L — ABNORMAL LOW (ref 135–145)
Sodium: 136 mEq/L (ref 135–145)
Sodium: 136 mEq/L (ref 135–145)

## 2010-03-30 LAB — COMPREHENSIVE METABOLIC PANEL
ALT: 33 U/L (ref 0–35)
AST: 26 U/L (ref 0–37)
BUN: 51 mg/dL — ABNORMAL HIGH (ref 6–23)
CO2: 23 mEq/L (ref 19–32)
Calcium: 8.9 mg/dL (ref 8.4–10.5)
Chloride: 103 mEq/L (ref 96–112)
Creatinine, Ser: 2.18 mg/dL — ABNORMAL HIGH (ref 0.4–1.2)
Glucose, Bld: 333 mg/dL — ABNORMAL HIGH (ref 70–99)
Potassium: 5.1 mEq/L (ref 3.5–5.1)
Total Protein: 6.1 g/dL (ref 6.0–8.3)

## 2010-03-30 LAB — PROTEIN ELECTROPH W RFLX QUANT IMMUNOGLOBULINS
Alpha-1-Globulin: 11.2 % — ABNORMAL HIGH (ref 2.9–4.9)
Alpha-2-Globulin: 14.4 % — ABNORMAL HIGH (ref 7.1–11.8)
Beta Globulin: 6.7 % (ref 4.7–7.2)
M-Spike, %: NOT DETECTED g/dL
Total Protein ELP: 5.4 g/dL — ABNORMAL LOW (ref 6.0–8.3)

## 2010-03-30 LAB — C4 COMPLEMENT: Complement C4, Body Fluid: 37 mg/dL (ref 16–47)

## 2010-03-30 LAB — BRAIN NATRIURETIC PEPTIDE
Pro B Natriuretic peptide (BNP): 246 pg/mL — ABNORMAL HIGH (ref 0.0–100.0)
Pro B Natriuretic peptide (BNP): 557 pg/mL — ABNORMAL HIGH (ref 0.0–100.0)

## 2010-03-30 LAB — UIFE/LIGHT CHAINS/TP QN, 24-HR UR
Alpha 2, Urine: DETECTED — AB
Free Kappa/Lambda Ratio: 10.07 ratio — ABNORMAL HIGH (ref 0.46–4.00)
Free Lambda Lt Chains,Ur: 1.45 mg/dL — ABNORMAL HIGH (ref 0.08–1.01)
Free Lt Chn Excr Rate: 58.4 mg/d
Total Protein, Urine-Ur/day: 389 mg/d — ABNORMAL HIGH (ref 10–140)
Total Protein, Urine: 97.2 mg/dL
Volume, Urine: 400 mL

## 2010-03-30 LAB — HEPARIN LEVEL (UNFRACTIONATED)
Heparin Unfractionated: 0.12 IU/mL — ABNORMAL LOW (ref 0.30–0.70)
Heparin Unfractionated: 0.23 IU/mL — ABNORMAL LOW (ref 0.30–0.70)
Heparin Unfractionated: 0.29 IU/mL — ABNORMAL LOW (ref 0.30–0.70)

## 2010-03-30 LAB — URINALYSIS, ROUTINE W REFLEX MICROSCOPIC
Ketones, ur: NEGATIVE mg/dL
Nitrite: NEGATIVE
Specific Gravity, Urine: 1.016 (ref 1.005–1.030)
Urobilinogen, UA: 0.2 mg/dL (ref 0.0–1.0)
pH: 5.5 (ref 5.0–8.0)

## 2010-03-30 LAB — URINE MICROSCOPIC-ADD ON

## 2010-03-30 LAB — IMMUNOFIXATION ADD-ON

## 2010-03-30 LAB — CREATININE, URINE, 24 HOUR
Collection Interval-UCRE24: 24 hours
Creatinine, 24H Ur: 578 mg/d — ABNORMAL LOW (ref 700–1800)
Urine Total Volume-UCRE24: 400 mL

## 2010-03-30 LAB — CARDIAC PANEL(CRET KIN+CKTOT+MB+TROPI)
CK, MB: 1.5 ng/mL (ref 0.3–4.0)
Relative Index: INVALID (ref 0.0–2.5)
Total CK: 34 U/L (ref 7–177)

## 2010-03-30 LAB — C3 COMPLEMENT: C3 Complement: 118 mg/dL (ref 88–201)

## 2010-03-30 LAB — ANA: Anti Nuclear Antibody(ANA): NEGATIVE

## 2010-03-30 LAB — URINE CULTURE

## 2010-03-30 LAB — TSH: TSH: 7.927 u[IU]/mL — ABNORMAL HIGH (ref 0.350–4.500)

## 2010-03-30 LAB — IGG, IGA, IGM: IgG (Immunoglobin G), Serum: 588 mg/dL — ABNORMAL LOW (ref 694–1618)

## 2010-04-04 NOTE — Progress Notes (Signed)
Summary: needs new rx-lost hers  Phone Note Call from Patient Message from:  Patient on March 29, 2010 2:06 PM  Caller: [phone] Reason for Call: Talk to Nurse Summary of Call: pt misplaced her three written rx's and wants them called in, however doesn't know the name Initial call taken by: Glynda Jaeger,  March 29, 2010 2:08 PM  Follow-up for Phone Call        Called patient back-states her husband had the 3 written Rxs from her discharge yesterday in his pocket and misplaced them.  She doesn't remember the names of the meds.  Was d/cd by Ronie Spies [facility] for [place].  Paged Dayna at [phone].  Awaiting her call and then will call patient back.  Follow-up by: Dessie Coma  LPN,  March 29, 2010 2:36 PM  Additional Follow-up for Phone Call Additional follow up Details #1::        T.C. from [facility] to call in Rxs for Cipro, Clonidine, and Lipitor.  E-prescribed Rxs to [facility] in [place]. Additional Follow-up by: Dessie Coma  LPN,  March 29, 2010 3:02 PM    New/Updated Medications: CIPRO 500 MG TABS (CIPROFLOXACIN HCL) To take one tablet daily x 4 more days CLONIDINE HCL 0.1 MG TABS (CLONIDINE HCL) To take one tablet by mouth two times a day x 2 days, then decrease to one tablet by mouth once daily x2 days then discontinue. LIPITOR 20 MG TABS (ATORVASTATIN CALCIUM) Take one tablet by mouth daily at bedtime Prescriptions: LIPITOR 20 MG TABS (ATORVASTATIN CALCIUM) Take one tablet by mouth daily at bedtime  #30 x 4   Entered by:   Dessie Coma  LPN   Authorized by:   Ferman Hamming, MD, [facility]   Signed by:   Dessie Coma  LPN on 19/14/7829   Method used:   Electronically to        [facility] [phone]* (retail)       [address] [place], [place]  [address]       Ph: [phone]       Fax: [phone]   RxID:   [id number] CLONIDINE HCL 0.1 MG TABS (CLONIDINE HCL) To take one tablet by mouth two times a day x 2 days, then decrease to  one tablet by mouth once daily x2 days then discontinue.  #10 x 0   Entered by:   Dessie Coma  LPN   Authorized by:   Ferman Hamming, MD, [facility]   Signed by:   Dessie Coma  LPN on 95/63/8756   Method used:   Electronically to        [facility] [phone]* (retail)       [address] [place], [place]  [address]       Ph: [phone]       Fax: [phone]   RxID:   [id number] CIPRO 500 MG TABS (CIPROFLOXACIN HCL) To take one tablet daily x 4 more days  #4 x 0   Entered by:   Dessie Coma  LPN   Authorized by:   Ferman Hamming, MD, [facility]   Signed by:   Dessie Coma  LPN on 83/15/1761   Method used:   Electronically to        [facility] [phone]* (retail)       [address] [place], [place]  [address]  Ph: [phone]       Fax: [phone]   RxID:   [id number]

## 2010-04-10 ENCOUNTER — Ambulatory Visit
Admission: RE | Admit: 2010-04-10 | Discharge: 2010-04-10 | Disposition: A | Discharge status: Home / Self Care | Payer: Medicare Other | Source: Ambulatory Visit | Attending: Family Medicine | Admitting: Family Medicine

## 2010-04-10 ENCOUNTER — Ambulatory Visit (INDEPENDENT_AMBULATORY_CARE_PROVIDER_SITE_OTHER): Payer: Medicare Other | Admitting: Family Medicine

## 2010-04-10 ENCOUNTER — Encounter: Payer: Self-pay | Admitting: Family Medicine

## 2010-04-10 DIAGNOSIS — I2510 Atherosclerotic heart disease of native coronary artery without angina pectoris: Secondary | ICD-10-CM

## 2010-04-10 DIAGNOSIS — I739 Peripheral vascular disease, unspecified: Secondary | ICD-10-CM

## 2010-04-10 DIAGNOSIS — E109 Type 1 diabetes mellitus without complications: Secondary | ICD-10-CM

## 2010-04-10 DIAGNOSIS — E785 Hyperlipidemia, unspecified: Secondary | ICD-10-CM

## 2010-04-10 DIAGNOSIS — I10 Essential (primary) hypertension: Secondary | ICD-10-CM

## 2010-04-10 DIAGNOSIS — I639 Cerebral infarction, unspecified: Secondary | ICD-10-CM

## 2010-04-10 DIAGNOSIS — R109 Unspecified abdominal pain: Secondary | ICD-10-CM

## 2010-04-10 DIAGNOSIS — N259 Disorder resulting from impaired renal tubular function, unspecified: Secondary | ICD-10-CM

## 2010-04-10 LAB — COMPREHENSIVE METABOLIC PANEL
Alkaline Phosphatase: 102 U/L (ref 39–117)
BUN: 29 mg/dL — ABNORMAL HIGH (ref 6–23)
Glucose, Bld: 342 mg/dL — ABNORMAL HIGH (ref 70–99)
Total Bilirubin: 0.4 mg/dL (ref 0.3–1.2)

## 2010-04-10 LAB — CBC WITH DIFFERENTIAL/PLATELET
Basophils Absolute: 0 10*3/uL (ref 0.0–0.1)
Basophils Relative: 0 % (ref 0–1)
Eosinophils Absolute: 0.2 10*3/uL (ref 0.0–0.7)
Hemoglobin: 11.4 g/dL — ABNORMAL LOW (ref 12.0–15.0)
MCH: 27 pg (ref 26.0–34.0)
MCHC: 32.5 g/dL (ref 30.0–36.0)
Monocytes Absolute: 0.3 10*3/uL (ref 0.1–1.0)
Monocytes Relative: 5 % (ref 3–12)
Neutrophils Relative %: 78 % — ABNORMAL HIGH (ref 43–77)
RDW: 14.1 % (ref 11.5–15.5)

## 2010-04-10 LAB — LACTATE DEHYDROGENASE: LDH: 163 U/L (ref 94–250)

## 2010-04-10 MED ORDER — OXYCODONE HCL 5 MG PO TABS: 5.0000 mg | ORAL_TABLET | Freq: Three times a day (TID) | ORAL | Status: AC | PRN

## 2010-04-10 NOTE — Progress Notes (Signed)
Subjective:    Patient ID: [name], female    DOB: [birth year], 70 y.o.   MRN: [id number]  HPI 70 yo WF presents for HFU visit.  She was admitted to [facility] 3-9 to 3-13 for a syncopal event  Felt to be vasovagal syncope from dehydration and a UTI.  Given her hx of strokes and MIs, a full w/u was done.  It appears that she had an acute stroke on brain MRI with new onset weakness.  She ruled out for acute cardiac event.  Her Cr was elevated during admission and her sugars were high.  She was to f/u with me for her sugars, which she says that she is taking everyhing ( but has a long hx of both medication and dietary non compliance).  She is not a great historian today as she is having acute abd pain.  She started to have abd pain on Sat night in the entire abdomen with cramping followed by loose watery stools in her colostomy bag.  This began with N/V on the 1st and 2nd day which has resolved.   She has had multiple abdominal surgeries for colorectal cancer.  She is not eating much..  Denies fevers.  She had a hernia last year.  She was on antibiotics for a UTI.    BP 195/81  Pulse 65  Temp(Src) 98.2 F (36.8 C) (Oral)  Ht 5\' 2"  (1.575 m)  Wt 198 lb (89.812 kg)  BMI 36.21 kg/m2  SpO2 98%  Past Medical History  Diagnosis Date  . Rectal cancer     radiation, chemo, surgery 2001  . HTN (hypertension)   . DM (diabetes mellitus)   . Hyperlipidemia   . Hypothyroidism   . Obesity   . Breast cancer   . DVT (deep venous thrombosis)   . Renal insufficiency   . Back pain     Past Surgical History  Procedure Date  . Gallbladder surgery   . Appendectomy   . Breast lumpectomy   . Ankle surgery     Family History  Problem Relation Age of Onset  . Heart failure Mother   . COPD Father   . Diabetes    . Coronary artery disease      History   Social History  . Marital Status: Married    Spouse Name: N/A    Number of Children: N/A  . Years of Education: N/A   Occupational History  .  Not on file.   Social History Main Topics  . Smoking status: Never Smoker   . Smokeless tobacco: Not on file  . Alcohol Use: Not on file  . Drug Use: Not on file  . Sexually Active: Not on file   Other Topics Concern  . Not on file   Social History Narrative   Retried from medical billing.Finished HS.Married to [place]. Has 3 kids.Never smoked.Rare ETOH.No regular exericise.    Not on File  Current outpatient prescriptions:amLODipine (NORVASC) 10 MG tablet, Take 10 mg by mouth daily.  , Disp: , Rfl: ;  atorvastatin (LIPITOR) 20 MG tablet, Take 20 mg by mouth at bedtime.  , Disp: , Rfl: ;  clopidogrel (PLAVIX) 75 MG tablet, Take 75 mg by mouth daily.  , Disp: , Rfl: ;  hydrALAZINE (APRESOLINE) 50 MG tablet, Take 50 mg by mouth 2 (two) times daily.  , Disp: , Rfl:  insulin glargine (LANTUS SOLOSTAR) 100 UNIT/ML injection, Inject 24 Units into the skin every morning.  , Disp: , Rfl: ;  insulin lispro (HUMALOG KWIKPEN) 100 UNIT/ML injection, Inject 5-8 Units into the skin 3 (three) times daily before meals. Use as directed per sliding scale , Disp: , Rfl: ;  levothyroxine (SYNTHROID, LEVOTHROID) 50 MCG tablet, Take 50 mcg by mouth daily.  , Disp: , Rfl:  oxyCODONE (ROXICODONE) 5 MG immediate release tablet, Take 1 tablet (5 mg total) by mouth every 8 (eight) hours as needed for pain., Disp: 20 tablet, Rfl: 0    Review of Systems  Constitutional: Positive for chills, appetite change and fatigue. Negative for fever.  Eyes: Negative for visual disturbance.  Respiratory: Negative for cough and shortness of breath.   Cardiovascular: Negative for chest pain, palpitations and leg swelling.  Gastrointestinal: Positive for abdominal pain and abdominal distention. Negative for constipation and blood in stool.  Genitourinary: Positive for pelvic pain. Negative for dysuria and difficulty urinating.  Musculoskeletal: Negative for back pain.  Neurological: Positive for weakness and numbness. Negative for  dizziness and headaches.       Objective:   Physical Exam  Constitutional: She is oriented to person, place, and time. She appears well-nourished. She has a sickly appearance (appears in mild distress with waves of abd pain). She appears distressed.       Obese, here with husband  HENT:  Head: Normocephalic and atraumatic.  Eyes: Pupils are equal, round, and reactive to light. No scleral icterus.  Neck: Normal range of motion. Neck supple. No JVD present.  Cardiovascular: Normal rate, regular rhythm and normal heart sounds.   No murmur heard. Pulmonary/Chest: Effort normal and breath sounds normal. No respiratory distress. She has no wheezes. She has no rales.  Abdominal: Soft. She exhibits distension. There is tenderness (diffusely tender). There is guarding.       Colostomy bag LLQ  Neurological: She is alert and oriented to person, place, and time.  Skin: Skin is warm and dry. She is not diaphoretic. No pallor.  Psychiatric:       Flat affect          Assessment & Plan:

## 2010-04-11 ENCOUNTER — Encounter: Payer: Self-pay | Admitting: Cardiology

## 2010-04-11 ENCOUNTER — Encounter: Payer: Self-pay | Admitting: *Deleted

## 2010-04-11 ENCOUNTER — Telehealth: Payer: Self-pay | Admitting: Family Medicine

## 2010-04-11 DIAGNOSIS — I739 Peripheral vascular disease, unspecified: Secondary | ICD-10-CM | POA: Insufficient documentation

## 2010-04-11 DIAGNOSIS — I639 Cerebral infarction, unspecified: Secondary | ICD-10-CM | POA: Insufficient documentation

## 2010-04-11 NOTE — Telephone Encounter (Signed)
Pls let pt know that her labs actually look better than when she was recently in the hospital.  Her stool for C. Diff is still pending.  She can use the RX Oxycodone as needed and should stick to a clear liquid diet today.  If pain worsens or she has vomitting or fever, she needs to go back to the ED.

## 2010-04-11 NOTE — Assessment & Plan Note (Signed)
Her statin was changed to Lipitor during hospitalization last wk due to cost.  Her Tchol was 233, TG 325, HDL 48, LDL 120.

## 2010-04-11 NOTE — Assessment & Plan Note (Addendum)
Poorly compliant.  A1C was 12.8 in the hospital last wk.  We reviewed her regimen today.  She is taking everything on her list and has short acting insulin to use for sliding scale.  Will get a chemistry panel today.  I suspect that her acute abd pain/ V/D are related to recent increases in sugar readings since she is barely eating anything.

## 2010-04-11 NOTE — Assessment & Plan Note (Signed)
MRI brain done 03-25-2010 during hospitalization for syncope which was felt to be vagally mediated  From a UTI, dehydration.  MRI showed a remote inferior R cerebellar infarct with encephalomalacia  Along the anterior margin there was a small acute infarct.  She has f/u with Neurologist next wk.  Pt has had some generalized increase in weakness.  She is on Plavix + ASA.

## 2010-04-11 NOTE — Assessment & Plan Note (Signed)
New onset abdominal pain with vomitting that has resolved and liquid stools in her colostomy bag x 2-3 days.  Hx of multiple abdominal surgeries.  Recently finished abx for her UTI and is high risk for C. Diff.  Will get a sample today and test.  2 view abd xray today and labs to look for infection, bleeding, sepsis.  If pain worsens, she dev. Fever or vomitting, she is to return to the ED or call 911.  Clear liquid diet with fluid rehydration and RX for oxycodone given to use for pain for now.

## 2010-04-11 NOTE — Assessment & Plan Note (Signed)
Pt had acute on chronic renal insuff from dehydration/ UTI.  Her Cr peaked at 2.82 and was 2.27 at time of discharge.  Will recheck.

## 2010-04-11 NOTE — Assessment & Plan Note (Signed)
BP remains high today.  She claims to be compliant with her meds but according to her hospital discharge notes, her coreg and clonidine were held which she says that she is now taking.  In the setting of acute abd pain, recent vomitting and diarrhea, I did not change her meds today.

## 2010-04-11 NOTE — Telephone Encounter (Signed)
LMOM informing Pt of the above 

## 2010-04-11 NOTE — Assessment & Plan Note (Signed)
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

## 2010-04-12 ENCOUNTER — Ambulatory Visit (INDEPENDENT_AMBULATORY_CARE_PROVIDER_SITE_OTHER): Payer: Medicare Other | Admitting: Cardiology

## 2010-04-12 ENCOUNTER — Encounter: Payer: Self-pay | Admitting: Cardiology

## 2010-04-12 DIAGNOSIS — R55 Syncope and collapse: Secondary | ICD-10-CM | POA: Insufficient documentation

## 2010-04-12 DIAGNOSIS — I10 Essential (primary) hypertension: Secondary | ICD-10-CM

## 2010-04-12 DIAGNOSIS — I6350 Cerebral infarction due to unspecified occlusion or stenosis of unspecified cerebral artery: Secondary | ICD-10-CM

## 2010-04-12 DIAGNOSIS — E785 Hyperlipidemia, unspecified: Secondary | ICD-10-CM

## 2010-04-12 DIAGNOSIS — N259 Disorder resulting from impaired renal tubular function, unspecified: Secondary | ICD-10-CM

## 2010-04-12 DIAGNOSIS — I639 Cerebral infarction, unspecified: Secondary | ICD-10-CM

## 2010-04-12 DIAGNOSIS — I2510 Atherosclerotic heart disease of native coronary artery without angina pectoris: Secondary | ICD-10-CM

## 2010-04-12 DIAGNOSIS — I739 Peripheral vascular disease, unspecified: Secondary | ICD-10-CM

## 2010-04-12 NOTE — Assessment & Plan Note (Addendum)
Continue aspirin, Plavix and statin. Beta blocker discontinued secondary to bradycardia. Patient would prefer to be seen closer to [facility] when she is hospitalized. She will find a cardiologist in [place] and we will be happy to forward records. We will see her back as needed.

## 2010-04-12 NOTE — Progress Notes (Signed)
EAV:[id number] female for F/U of hypertension and  CAD. A dobutamine Myoview in Sept 2011 was abnormal. Her Coumadin was held and she underwent cardiac catheterization on October 18, 2009. This revealed a normal left main. There was a 40-50% ostial LAD.There were  multiple stents in the mid LAD, which were widely patent.  The distal  edge of the LAD stents showed a 70-80% eccentric stenosis. First diagonal branch had 50% ostial disease and was somewhat jailed by the stents. There was 50-60% eccentric mid circumflex. There were multiple widely patent stents in the second obtuse marginal branch.  The distal OM branch was diffusely diseased in the 80% range and not suitable for angioplasty. There was a 70% in-stent  restenosis in the proximal RCA. The ostium of the PDA had 40% tubular disease. The patient had PCI of the right coronary artery and LAD with drug-eluting stents. She did have contrast nephropathy transiently afterwards. Admitted in March 2012 with syncope felt to be vagally mediated; echo showed normal LV function and elevated filling pressures. MRI revealed a tiny acute infarct in the right parietal lobe. There was a previous small cerebellar infarct. Carotid Dopplers showed a 40-59% right stenosis and no significant stenosis on the left. Patient's clonidine and Coreg were discontinued secondary to bradycardia. Since discharge, she has dyspnea with more moderate activities routine. There is no orthopnea, PND, pedal edema, exertional chest pain or recurrent syncope. She is unsteady with her gait.   Current Outpatient Prescriptions  Medication Sig Dispense Refill  . amLODipine (NORVASC) 10 MG tablet Take 10 mg by mouth daily.        Marland Kitchen aspirin 81 MG tablet Take 81 mg by mouth daily.        Marland Kitchen atorvastatin (LIPITOR) 20 MG tablet Take 20 mg by mouth at bedtime.        . clopidogrel (PLAVIX) 75 MG tablet Take 75 mg by mouth daily.        . hydrALAZINE (APRESOLINE) 50 MG tablet Take 50 mg by mouth 2 (two)  times daily.        . insulin glargine (LANTUS SOLOSTAR) 100 UNIT/ML injection Inject 24 Units into the skin every morning.        . insulin lispro (HUMALOG KWIKPEN) 100 UNIT/ML injection Inject 5-8 Units into the skin 3 (three) times daily before meals. Use as directed per sliding scale       . levothyroxine (SYNTHROID, LEVOTHROID) 50 MCG tablet Take 50 mcg by mouth daily.        Marland Kitchen oxyCODONE (ROXICODONE) 5 MG immediate release tablet Take 1 tablet (5 mg total) by mouth every 8 (eight) hours as needed for pain.  20 tablet  0     Past Medical History  Diagnosis Date  . Rectal cancer     radiation, chemo, surgery 2001  . HTN (hypertension)   . DM (diabetes mellitus)   . Hyperlipidemia   . Hypothyroidism   . Obesity   . Breast cancer   . DVT (deep venous thrombosis)   . Renal insufficiency   . Back pain   . CAD (coronary artery disease)   . CVA (cerebral infarction)     Past Surgical History  Procedure Date  . Gallbladder surgery   . Appendectomy   . Breast lumpectomy   . Ankle surgery   . Abdominal hysterectomy     History   Social History  . Marital Status: Married    Spouse Name: N/A    Number of Children:  N/A  . Years of Education: N/A   Occupational History  . Not on file.   Social History Main Topics  . Smoking status: Never Smoker   . Smokeless tobacco: Not on file  . Alcohol Use: No  . Drug Use: Not on file  . Sexually Active: Not on file   Other Topics Concern  . 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    ROS: Recent abdominal pain and diarrhea but no fevers or chills, productive cough, hemoptysis, dysphasia, odynophagia, melena, hematochezia, dysuria, hematuria, rash, seizure activity, orthopnea, PND, pedal edema, claudication. Remaining systems are negative.  Physical Exam: Well-developed well-nourished in no acute distress.  Skin is  warm and dry.  HEENT is normal.  Neck is supple. No thyromegaly.  Chest is clear to auscultation with normal expansion.  Cardiovascular exam is regular rate and rhythm.  Abdominal exam nontender or distended. No masses palpated. Extremities show no edema. neuro grossly intact  ECG Sinus bradycardia at a rate of 59. Nonspecific ST changes.

## 2010-04-12 NOTE — Assessment & Plan Note (Signed)
Continue statin. Check lipids and liver. 

## 2010-04-12 NOTE — Assessment & Plan Note (Signed)
Continue aspirin and statin. Followup carotid Dopplers March 2013.

## 2010-04-12 NOTE — Assessment & Plan Note (Signed)
Recent small CVA on MRI. Echocardiogram showed normal LV function and no source of embolus. Carotid Dopplers as outlined above. Continue aspirin and statin.

## 2010-04-12 NOTE — Assessment & Plan Note (Signed)
Followed by nephrology. 

## 2010-04-12 NOTE — Patient Instructions (Signed)
FASTING LAB WORK IN 2 WEEKS TAKE HYDRALAZINE THREE TIMES DAILY

## 2010-04-12 NOTE — Assessment & Plan Note (Signed)
Blood pressure improved. She is only taking her hydralazine b.i.d. Increased to 50 mg p.o. T.i.d. Continue off of clonidine and beta blocker secondary to bradycardia.

## 2010-04-12 NOTE — Assessment & Plan Note (Signed)
No recurrent episodes. Most likely vagally mediated. Will plan monitor if she has recurrence.

## 2010-04-13 ENCOUNTER — Telehealth (INDEPENDENT_AMBULATORY_CARE_PROVIDER_SITE_OTHER): Payer: Medicare Other | Admitting: Family Medicine

## 2010-04-13 DIAGNOSIS — C19 Malignant neoplasm of rectosigmoid junction: Secondary | ICD-10-CM

## 2010-04-13 DIAGNOSIS — A09 Infectious gastroenteritis and colitis, unspecified: Secondary | ICD-10-CM | POA: Insufficient documentation

## 2010-04-13 DIAGNOSIS — I639 Cerebral infarction, unspecified: Secondary | ICD-10-CM

## 2010-04-13 MED ORDER — METRONIDAZOLE 500 MG PO TABS: 500.0000 mg | ORAL_TABLET | Freq: Three times a day (TID) | ORAL | Status: AC

## 2010-04-13 MED ORDER — CIPROFLOXACIN HCL 250 MG PO TABS: 250.0000 mg | ORAL_TABLET | Freq: Two times a day (BID) | ORAL | Status: AC

## 2010-04-13 NOTE — Telephone Encounter (Signed)
I called pt with her results, C . Diff still pending.  She is still fatigued, with less abd pain, achiness and liquidy stools.  She is also having dysuria.  Her ANC count was high.   I will go ahead and start her on Cipro + Flagyl for poss UTI + poss C  Diff.  She is to continue fluid hydration.  Has RX oxycodone to use as needed.    She also needs appts for neuro in [place] and oncology in [place] for routine f/u. She needs OV with me next wk.

## 2010-04-16 ENCOUNTER — Telehealth: Payer: Self-pay | Admitting: Family Medicine

## 2010-04-16 NOTE — Telephone Encounter (Signed)
[name], pls call the lab and find out what happened to her tests for C . Diff.  Looks like they cancelled it and I really need this info.

## 2010-04-17 NOTE — Telephone Encounter (Signed)
Still pending

## 2010-04-19 LAB — CLOSTRIDIUM DIFFICILE TOXIN

## 2010-05-07 ENCOUNTER — Encounter: Payer: Self-pay | Admitting: Family Medicine

## 2010-05-08 ENCOUNTER — Ambulatory Visit: Payer: Medicare Other | Admitting: Family Medicine

## 2010-05-08 ENCOUNTER — Encounter: Payer: Self-pay | Admitting: Family Medicine

## 2010-05-09 ENCOUNTER — Encounter: Payer: Self-pay | Admitting: Family Medicine

## 2010-05-09 ENCOUNTER — Ambulatory Visit (INDEPENDENT_AMBULATORY_CARE_PROVIDER_SITE_OTHER): Payer: Medicare Other | Admitting: Family Medicine

## 2010-05-09 VITALS — BP 197/85 | HR 84 | Ht 62.0 in | Wt 198.0 lb

## 2010-05-09 DIAGNOSIS — E785 Hyperlipidemia, unspecified: Secondary | ICD-10-CM

## 2010-05-09 DIAGNOSIS — R109 Unspecified abdominal pain: Secondary | ICD-10-CM

## 2010-05-09 DIAGNOSIS — I6350 Cerebral infarction due to unspecified occlusion or stenosis of unspecified cerebral artery: Secondary | ICD-10-CM

## 2010-05-09 DIAGNOSIS — I10 Essential (primary) hypertension: Secondary | ICD-10-CM

## 2010-05-09 DIAGNOSIS — I639 Cerebral infarction, unspecified: Secondary | ICD-10-CM

## 2010-05-09 NOTE — Assessment & Plan Note (Signed)
Reviewed her plan of care and the importance of improing her BP, cholesterol and taking Plavix and ASA daily.  She has declined a neuro referral.

## 2010-05-09 NOTE — Assessment & Plan Note (Signed)
BP still high today with a long hx of medical non adherence.  I will get a copy of notes from Dr Angelita Ingles since she reports that some of her meds were changed but she didn't bring them in today.  She also has some CKD and is seeing Dr Lowell Guitar back this wk.

## 2010-05-09 NOTE — Patient Instructions (Signed)
Return for f/u in 6 wks.

## 2010-05-09 NOTE — Assessment & Plan Note (Signed)
Much improved w/o intervention.  Likely to have been viral process.  Overall, feeling much better.

## 2010-05-09 NOTE — Progress Notes (Signed)
  Subjective:    Patient ID: [name], female    DOB: 21-May-1940, 70 y.o.   MRN: [id number]  HPI  70 yo WF presents for f/u visit.  She got her ostomy supplies.  Her abdominal pain has much improved.  No fevers, chills or nausea.  Her sugars are running a little high.  She saw Dr Gertie Exon for cards and her BP meds were adjusted last wk.  She decided to not see neurology after her last visit here.  She sees nephrologist on Friday in [place].  She is due to have her cholesterol rechecked.  She is taking ASA + Plavix now.  Denies any focal weakness, trouble swallowing or functional loss.  Denies memory changes or HAs.  Energy level improved.      BP 197/85  Pulse 84  Ht 5\' 2"  (1.575 m)  Wt 198 lb (89.812 kg)  BMI 36.21 kg/m2  SpO2 97%    Review of Systems  Constitutional: Negative for fever and fatigue.  Respiratory: Negative for shortness of breath.   Cardiovascular: Negative for chest pain.  Gastrointestinal: Negative for nausea, vomiting, abdominal pain, diarrhea, constipation, blood in stool and abdominal distention.  Neurological: Negative for weakness, light-headedness, numbness and headaches.       Objective:   Physical Exam  Constitutional: She appears well-developed and well-nourished. No distress.  HENT:  Head: Normocephalic and atraumatic.  Eyes: Conjunctivae are normal. No scleral icterus.  Cardiovascular: Normal rate, regular rhythm and normal heart sounds.   Pulmonary/Chest: Effort normal and breath sounds normal. No respiratory distress.  Skin: Skin is warm and dry.  Psychiatric: She has a normal mood and affect.          Assessment & Plan:

## 2010-05-29 ENCOUNTER — Encounter: Payer: Self-pay | Admitting: Family Medicine

## 2010-05-29 ENCOUNTER — Telehealth: Payer: Self-pay | Admitting: Family Medicine

## 2010-05-29 NOTE — Telephone Encounter (Signed)
Junius Roads called from [facility] and Dr. Mathis Bud is requesting for Dr. Cathey Endow to return her call regarding pt. [name] DOB:  11-17-2040.   She was seen as a new patient today, and needs to speak with Dr. Cathey Endow regarding this. Plan:  Routed to Dr. Cathey Endow .Jarvis Newcomer, LPN [facility]

## 2010-06-07 ENCOUNTER — Encounter: Payer: Self-pay | Admitting: Family Medicine

## 2010-06-07 ENCOUNTER — Ambulatory Visit (INDEPENDENT_AMBULATORY_CARE_PROVIDER_SITE_OTHER): Payer: Medicare Other | Admitting: Family Medicine

## 2010-06-07 VITALS — BP 191/91 | HR 86 | Temp 98.2°F | Wt 197.0 lb

## 2010-06-07 DIAGNOSIS — I10 Essential (primary) hypertension: Secondary | ICD-10-CM

## 2010-06-07 DIAGNOSIS — R0982 Postnasal drip: Secondary | ICD-10-CM

## 2010-06-07 NOTE — Progress Notes (Signed)
  Subjective:    Patient ID: [name], female    DOB: 1940-09-10, 70 y.o.   MRN: [id number]  [facility] yo WF presents for a cough x 2 wks.  She has had an itchy throat and runny nose for several wks.  Not taking anything OTC.  The cough is annoying.  Able to sleep ok.  She feels a little SOB and has chest tightness.  No hx of asthma but has had allergy problems before.  She has some clear rhinorrhea.  She is sneezing a lot.  She denies sinus pressure.  She just took her BP meds.denies fevers or chills.  Denies sputum production.  BP 191/91  Pulse 86  Temp(Src) 98.2 F (36.8 C) (Oral)  Wt 197 lb (89.359 kg)  SpO2 98%     Review of Systems  Constitutional: Negative for fever, chills and fatigue.  HENT: Positive for rhinorrhea, sneezing and postnasal drip. Negative for ear pain, congestion, sore throat, trouble swallowing and neck pain.   Respiratory: Negative for shortness of breath and wheezing.   Cardiovascular: Negative for chest pain, palpitations and leg swelling.  Gastrointestinal: Negative for nausea, vomiting and diarrhea.  Skin: Negative for rash.  Neurological: Negative for headaches.       Objective:   Physical Exam  Constitutional: She appears well-developed and well-nourished. No distress.       overwt  HENT:  Head: Normocephalic and atraumatic.  Right Ear: External ear normal.  Left Ear: External ear normal.  Mouth/Throat: Oropharynx is clear and moist. No oropharyngeal exudate.       Clear rhinorrhea, boggy turbinates, cobblestoning  Eyes: Conjunctivae are normal. Left eye exhibits no discharge.  Neck: Neck supple.  Cardiovascular: Normal rate, regular rhythm and normal heart sounds.   Pulmonary/Chest: Effort normal and breath sounds normal. No respiratory distress. She has no wheezes.       Dry cough  Lymphadenopathy:    She has no cervical adenopathy.  Skin: Skin is warm and dry. No rash noted.          Assessment & Plan:  Allergic postnasal drip-- will  treat with Qnasl sample 2 sprays/ nostril daily.  OK to use claritin once daily.  Avoid decongestants with high BP.  No sign of bacterial sinusitis.  HTN- BP high today with long hx of medication non adherence.  She is to take everything on her list and call me with home reading next wk.

## 2010-06-07 NOTE — Patient Instructions (Signed)
For allergic postnasal drip:  Use Qnasal 2 sprays per nostril once a day for 2-3 wks.  OK to use OTC Claritin once daily for allergies (anti histamine).  Call me if any problems.  Check BPs at home and call me with home readings next wk.

## 2010-06-20 ENCOUNTER — Encounter: Payer: Self-pay | Admitting: Family Medicine

## 2010-06-20 ENCOUNTER — Ambulatory Visit (INDEPENDENT_AMBULATORY_CARE_PROVIDER_SITE_OTHER): Payer: Medicare Other | Admitting: Family Medicine

## 2010-06-20 DIAGNOSIS — M79605 Pain in left leg: Secondary | ICD-10-CM

## 2010-06-20 DIAGNOSIS — M79609 Pain in unspecified limb: Secondary | ICD-10-CM

## 2010-06-20 DIAGNOSIS — R0602 Shortness of breath: Secondary | ICD-10-CM

## 2010-06-20 DIAGNOSIS — I10 Essential (primary) hypertension: Secondary | ICD-10-CM

## 2010-06-20 DIAGNOSIS — E039 Hypothyroidism, unspecified: Secondary | ICD-10-CM

## 2010-06-20 LAB — D-DIMER, QUANTITATIVE: D-Dimer, Quant: 0.85 ug/mL-FEU — ABNORMAL HIGH (ref 0.00–0.48)

## 2010-06-20 MED ORDER — INSULIN LISPRO 100 UNIT/ML ~~LOC~~ SOLN: SUBCUTANEOUS | Status: DC

## 2010-06-20 MED ORDER — INSULIN GLARGINE 100 UNIT/ML ~~LOC~~ SOLN: SUBCUTANEOUS | Status: DC

## 2010-06-20 MED ORDER — HYDROXYZINE HCL 25 MG PO TABS: ORAL_TABLET | ORAL | Status: DC

## 2010-06-20 NOTE — Patient Instructions (Signed)
Labs today.  STAT. Will call you w/ results by tomorrow AM.  Use Tylenol as needed for pain.  Increase Lantus to 40 units once daily and change Humalog to 5 units with breakfast and lunch and 10 units at dinner.  Can inject right at the start of your meal.  REturn for follow up diabetes in 3 wks.

## 2010-06-20 NOTE — Progress Notes (Signed)
  Subjective:    Patient ID: [name], female    DOB: 18-Sep-1940, 70 y.o.   MRN: [id number]  HPI  70 yo WF presents for L hip pain for 2-3 wks.  No trauma or falls.  She has hx of blood clots.  She has swelling in the L leg which is new.  She is voiding well.  Denies calf pain.  She is due for bloodwork today. Denies fevers or chills. Came off coumadin in Feb for her last blood clot.  Denies chest pain, fevers or dyspnea.  She has not taken her BP meds today because she was 'too busy'.  BP 188/73  Pulse 65  Ht 5\' 3"  (1.6 m)  Wt 204 lb (92.534 kg)  BMI 36.14 kg/m2  SpO2 98% Patient Active Problem List  Diagnoses  . MALIGNANT NEOPLASM OF RECTUM  . NEOPLASM, MALIGNANT, RIGHT BREAST  . UNSPECIFIED HYPOTHYROIDISM  . IDDM  . HYPERLIPIDEMIA  . OBESITY, UNSPECIFIED  . ESSENTIAL HYPERTENSION, BENIGN  . CAD  . PHLEBITIS&THROMBOPHLEBITIS LOWER EXTREM UNSPEC  . RENAL INSUFFICIENCY  . OSTEOARTHRITIS, KNEE, LEFT  . LEG PAIN, LEFT  . SHORTNESS OF BREATH  . CHEST PAIN  . CVA (cerebral vascular accident)  . Peripheral vascular disease  . Abdominal pain  . Syncope  . Infectious colitis      Review of Systems  Constitutional: Positive for fatigue. Negative for appetite change.  Eyes: Negative for visual disturbance.  Respiratory: Negative for shortness of breath.   Cardiovascular: Positive for leg swelling. Negative for chest pain and palpitations.  Gastrointestinal: Negative for abdominal pain.  Genitourinary: Negative for decreased urine volume.  Musculoskeletal: Positive for arthralgias. Negative for back pain and gait problem.  Neurological: Negative for weakness.       Objective:   Physical Exam  Constitutional: She appears well-developed and well-nourished. No distress.       obese  Eyes: No scleral icterus.  Neck: Neck supple.  Cardiovascular: Normal rate, regular rhythm and normal heart sounds.   Pulmonary/Chest: Effort normal and breath sounds normal. No respiratory  distress. She has no rales.  Abdominal: Soft. She exhibits no distension.  Musculoskeletal: She exhibits edema (unilateral edema of the Left leg, 2+ pitting).  Skin: Skin is warm and dry. No erythema.  Psychiatric: She has a normal mood and affect.          Assessment & Plan:

## 2010-06-21 ENCOUNTER — Telehealth: Payer: Self-pay | Admitting: Family Medicine

## 2010-06-21 DIAGNOSIS — R7989 Other specified abnormal findings of blood chemistry: Secondary | ICD-10-CM

## 2010-06-21 DIAGNOSIS — M79605 Pain in left leg: Secondary | ICD-10-CM

## 2010-06-21 NOTE — Telephone Encounter (Signed)
Pls let pt know that her D dimer was elevated.  Will proceede with venous doppler ultrasound of the L leg to r/o clot.  We need to do this today.

## 2010-06-22 NOTE — Assessment & Plan Note (Signed)
L leg pain/ swelling w/o trauma.  D Dimer slightly high today.  Venous doppler u/s is NEG for DVT.  She will return for a hip XRAY to look for DJD.  She may have some thrombophlebitis and we may need to look at her inguinal Lymph nodes given  Her hx of cancer.  Work on leg elevation, compression hose for now and keep me posted for any changes.

## 2010-06-22 NOTE — Assessment & Plan Note (Signed)
BP continues to run high.  She continues to be non adherent with taking her meds and understands the risk for heart attack and stroke.  We discussed this today.  She agrees to work on improving compliance.  She will be updating her labs with nephrology office this wk.

## 2010-07-12 ENCOUNTER — Ambulatory Visit: Payer: Medicare Other | Admitting: Family Medicine

## 2010-07-17 ENCOUNTER — Other Ambulatory Visit: Payer: Self-pay | Admitting: Family Medicine

## 2010-07-17 MED ORDER — INSULIN PEN NEEDLE 31G X 8 MM MISC: Status: AC

## 2010-07-17 MED ORDER — AMLODIPINE BESYLATE 10 MG PO TABS: 10.0000 mg | ORAL_TABLET | Freq: Every day | ORAL | Status: AC

## 2010-07-17 NOTE — Telephone Encounter (Signed)
Pt requested refills of her norvasc 10 mg and insulin pen needles , but was suppose to return for a 4 week follow up for her diabetes .  Notified the pharmacist at  [facility] and they will inform the patient. Gave a 30 day supply of each of these request til patient satisfies an appt. Jarvis Newcomer, LPN [facility]

## 2011-05-10 ENCOUNTER — Telehealth: Payer: Self-pay | Admitting: Family Medicine

## 2011-05-10 NOTE — Telephone Encounter (Signed)
Physical patient's coronary received a copy of her lab work from her oncologist Dr. Mathis Bud. Her blood sugar was 330. She has not been seen in our office since the summer. SHe is encouraged to make an appointment to followup on her blood sugars.

## 2011-05-11 NOTE — Telephone Encounter (Signed)
Pt notified;pt is no longer a  pt with this practice.

## 2012-03-18 IMAGING — CR DG KNEE 1-2V*L*
2 series · 2 of 2 positions shown · non-contrast
Comparison: None.

CLINICAL DATA: Left knee pain with swelling

LEFT KNEE - 1-2 VIEW

[view not recorded (1 of 2)]
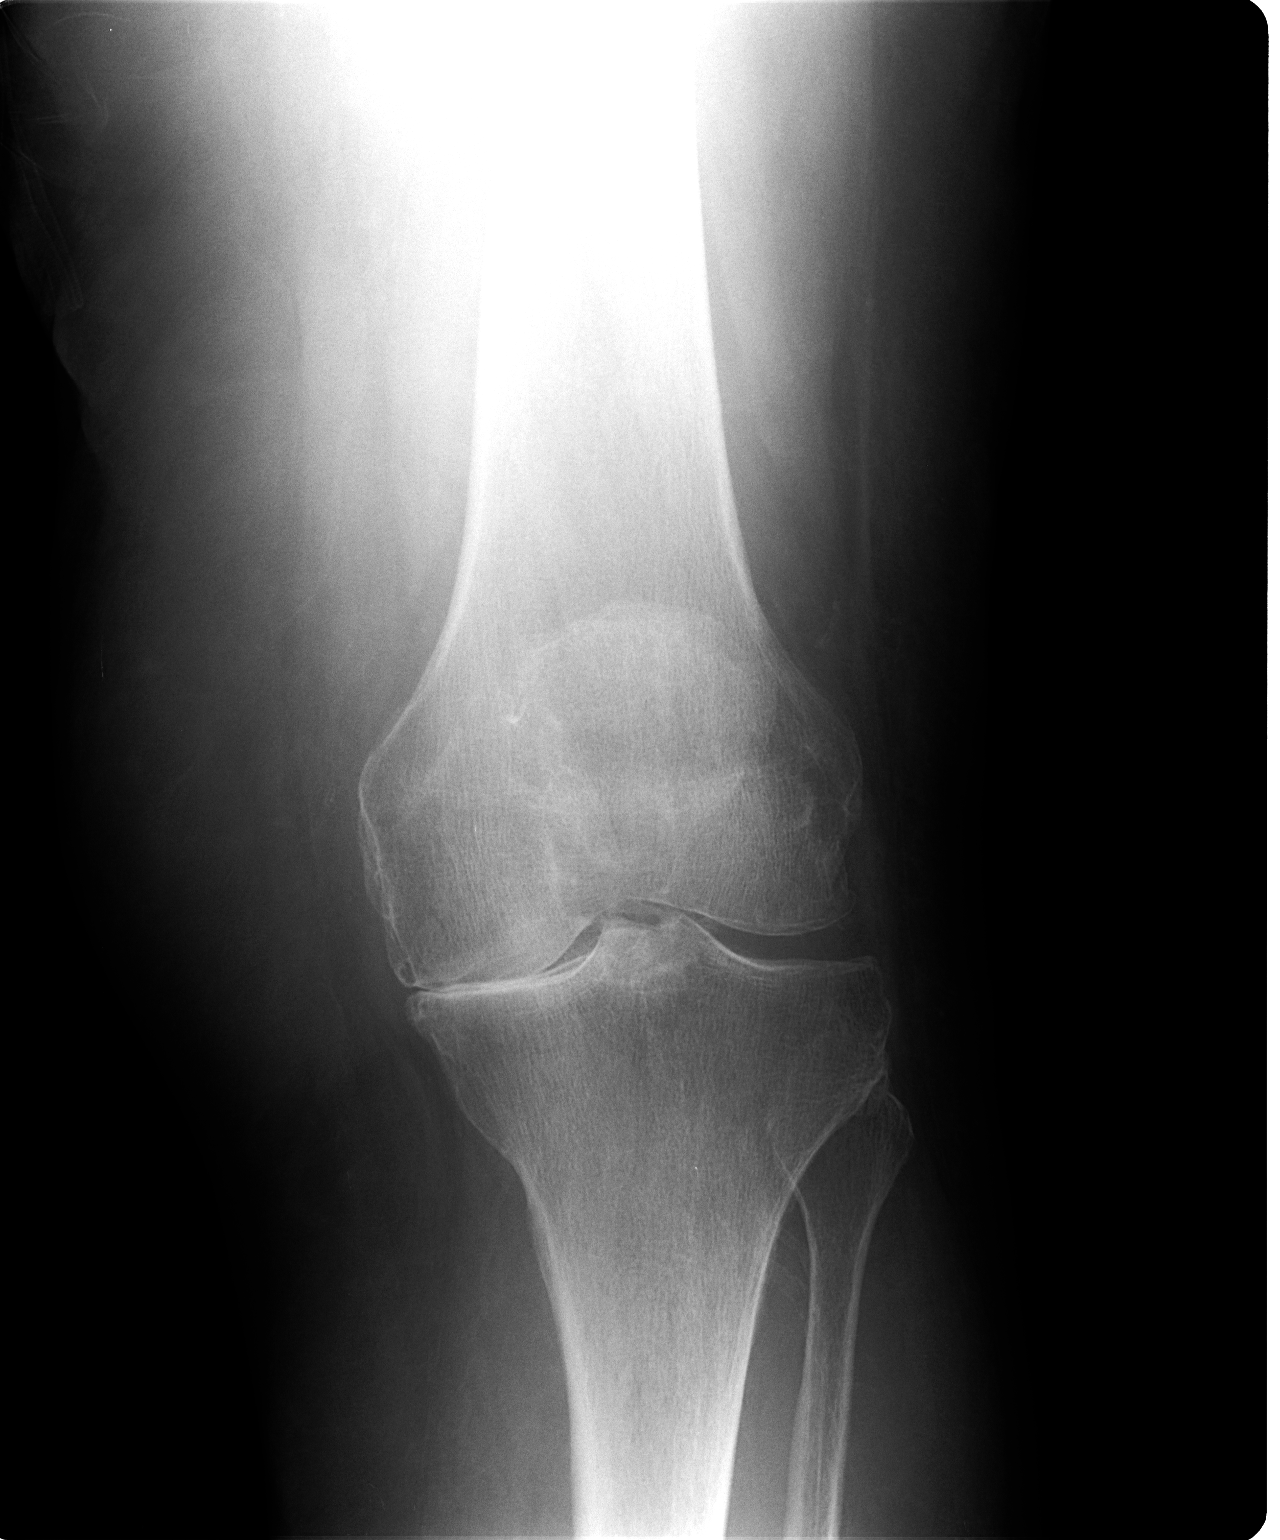

[view not recorded (2 of 2)]
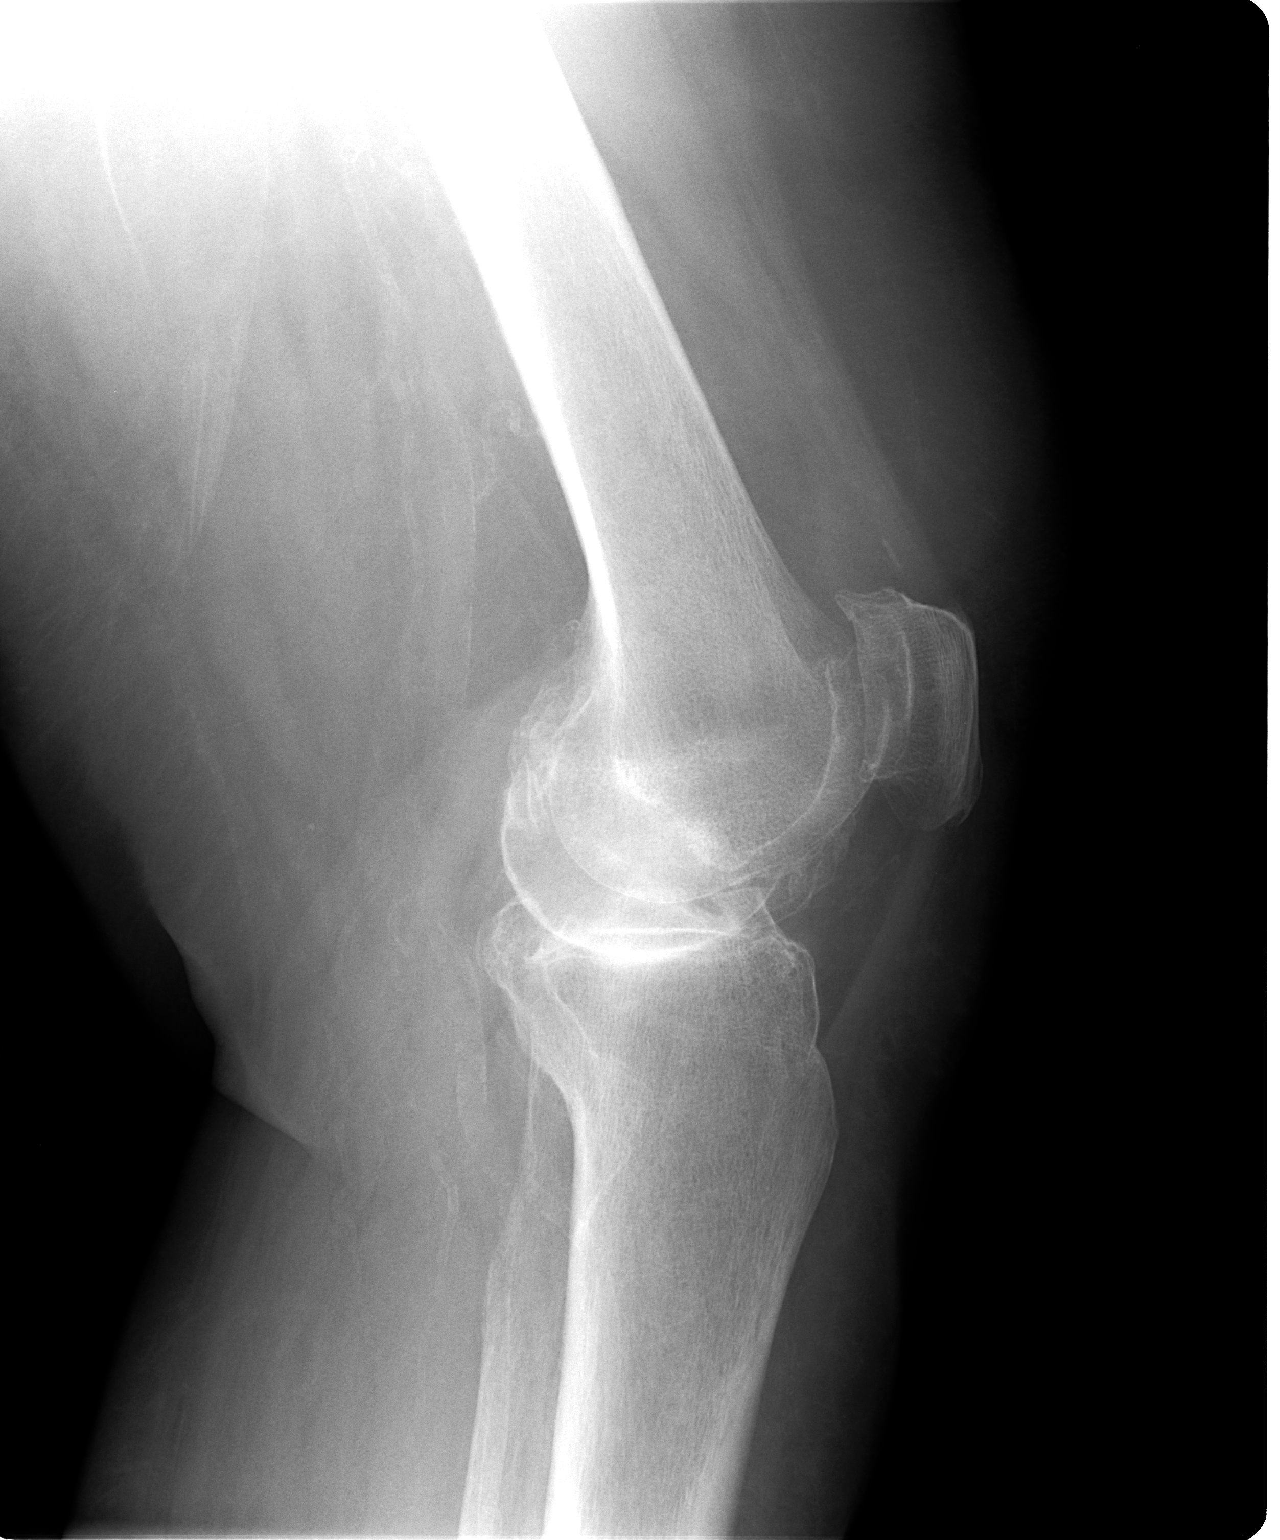

[2 of 2 positions shown; findings below may reference images not displayed]

FINDINGS: There is bicompartmental degenerative joint disease
primarily involve the medial compartment with lesser involvement of
the patellofemoral articulation.  No fracture is seen.  No joint
effusion is noted.
IMPRESSION: Bicompartmental degenerative joint disease.

## 2012-09-03 ENCOUNTER — Encounter: Payer: Self-pay | Admitting: Family Medicine

## 2012-09-03 ENCOUNTER — Ambulatory Visit (INDEPENDENT_AMBULATORY_CARE_PROVIDER_SITE_OTHER): Payer: Medicare Other | Admitting: Family Medicine

## 2012-09-03 ENCOUNTER — Ambulatory Visit (INDEPENDENT_AMBULATORY_CARE_PROVIDER_SITE_OTHER): Payer: Medicare Other

## 2012-09-03 VITALS — BP 151/72 | HR 94 | Wt 174.0 lb

## 2012-09-03 DIAGNOSIS — M533 Sacrococcygeal disorders, not elsewhere classified: Secondary | ICD-10-CM

## 2012-09-03 DIAGNOSIS — I7090 Unspecified atherosclerosis: Secondary | ICD-10-CM

## 2012-09-03 DIAGNOSIS — M899 Disorder of bone, unspecified: Secondary | ICD-10-CM

## 2012-09-03 DIAGNOSIS — S300XXA Contusion of lower back and pelvis, initial encounter: Secondary | ICD-10-CM

## 2012-09-03 DIAGNOSIS — M25559 Pain in unspecified hip: Secondary | ICD-10-CM

## 2012-09-03 MED ORDER — AMBULATORY NON FORMULARY MEDICATION: Status: AC

## 2012-09-03 NOTE — Progress Notes (Signed)
CC: [name] is a 72 y.o. female is here for Tailbone Pain   Subjective: HPI:  Former patient of our practice returns due to disagreement of care at most recent practice  Patient complains of falling on her buttocks one month ago unfortunately 3 times in what sounds like a matter of one week. At first from a fall she had immediate buttock pain localized to the tailbone described as moderate to severe with sitting or lying flat it is for the most part absent when standing. Pain is described as sharp and nonradiating worse on the right than the left. Unfortunately she has to sit for dialysis 4 hours twice a week. She reports a history of continued midline back pain which has not changed in character or severity since the accident. She denies saddle paresthesia nor bladder incontinence. She has a remote history of rectal cancer and believes her rectum is now sealed. She denies new weakness motor or sensory disturbances in her lower extremities   Review Of Systems Outlined In HPI  Past Medical History  Diagnosis Date  . Rectal cancer     radiation, chemo, surgery 2001  . HTN (hypertension)   . DM (diabetes mellitus)   . Hyperlipidemia   . Hypothyroidism   . Obesity   . Breast cancer   . DVT (deep venous thrombosis)   . Renal insufficiency   . Back pain   . CAD (coronary artery disease)   . CVA (cerebral infarction)      Family History  Problem Relation Age of Onset  . Heart failure Mother   . Other Mother     CHF  . COPD Father   . Other Father     CHF  . Diabetes    . Coronary artery disease    . Diabetes Sister   . Coronary artery disease Sister   . Diabetes Brother   . Coronary artery disease Brother      History  Substance Use Topics  . Smoking status: Never Smoker   . Smokeless tobacco: Not on file  . Alcohol Use: Yes     Comment: rare     Objective: Filed Vitals:   09/03/12 1100  BP: 151/72  Pulse: 94    Vital signs reviewed. General: Alert and  Oriented, No Acute Distress HEENT: Pupils equal, round, reactive to light. Conjunctivae clear.  External ears unremarkable.  Moist mucous membranes. Lungs: Clear and comfortable work of breathing, speaking in full sentences without accessory muscle use. Cardiac: Regular rate and rhythm.  Neuro: Strength in lower extremities normal Extremities: No peripheral edema.  Strong peripheral pulses.  Rectal: External rectum seems to be surgically closed and well-healed, pain is reproduced with palpation of proximal apex of sacrum posterior, the coccyx is not particularly tender however there is tenderness with palpation of the lower SI joints bilaterally.  Mental Status: No depression, anxiety, nor agitation. Logical though process. Skin: Warm and dry.  Assessment & Plan: Henritta was seen today for tailbone pain.  Diagnoses and associated orders for this visit:  Coccyx pain - DG Sacrum/Coccyx; Future - DG Pelvis 1-2 Views; Future  Sacral contusion, initial encounter  Other Orders - AMBULATORY NON FORMULARY MEDICATION; Donut cushion.  One unit, use only when seated to avoid tailbone pain.  Dx: Sacral Contusion    X-ray was obtained to rule out fracture versus dislocation of coccyx. Fortunately this was ruled out there were no other surprises on her x-ray, we discussed using a donut offloading see cushion  anytime she is sitting, we discussed the management options she would prefer to use leftover hydrocodone that she has at home, she will contact me she feels she needs something stronger. Discussed that it may take 1-2 months for this to completely heal but that offloading would expedite the process  Return in about 4 weeks (around 10/01/2012).

## 2012-10-07 ENCOUNTER — Telehealth: Payer: Self-pay | Admitting: Family Medicine

## 2012-10-07 NOTE — Telephone Encounter (Signed)
humalog instead of novolog

## 2012-11-04 ENCOUNTER — Ambulatory Visit (INDEPENDENT_AMBULATORY_CARE_PROVIDER_SITE_OTHER): Payer: Medicare Other | Admitting: Family Medicine

## 2012-11-04 ENCOUNTER — Encounter: Payer: Self-pay | Admitting: Family Medicine

## 2012-11-04 DIAGNOSIS — Z23 Encounter for immunization: Secondary | ICD-10-CM

## 2012-11-04 NOTE — Progress Notes (Signed)
I was present for all necessary aspects of today's encounter. 

## 2012-11-26 ENCOUNTER — Ambulatory Visit (INDEPENDENT_AMBULATORY_CARE_PROVIDER_SITE_OTHER): Payer: Medicare Other | Admitting: Family Medicine

## 2012-11-26 ENCOUNTER — Encounter: Payer: Self-pay | Admitting: Family Medicine

## 2012-11-26 VITALS — BP 180/90 | Wt 175.0 lb

## 2012-11-26 DIAGNOSIS — L299 Pruritus, unspecified: Secondary | ICD-10-CM | POA: Insufficient documentation

## 2012-11-26 DIAGNOSIS — E039 Hypothyroidism, unspecified: Secondary | ICD-10-CM | POA: Insufficient documentation

## 2012-11-26 DIAGNOSIS — E119 Type 2 diabetes mellitus without complications: Secondary | ICD-10-CM

## 2012-11-26 DIAGNOSIS — N186 End stage renal disease: Secondary | ICD-10-CM | POA: Insufficient documentation

## 2012-11-26 MED ORDER — INSULIN LISPRO 100 UNIT/ML (KWIKPEN): PEN_INJECTOR | SUBCUTANEOUS | Status: DC

## 2012-11-26 MED ORDER — HYDROXYZINE HCL 50 MG PO TABS: ORAL_TABLET | ORAL | Status: AC

## 2012-11-26 NOTE — Progress Notes (Signed)
CC: [name] is a 72 y.o. female is here for itching all over   Subjective: HPI:  Complains of weeks of itching however symptoms have slowly progressed from mild to severe. Reached their worst last night. Symptoms are always and only present in the evening. They involved the entire body they spare the face and the scalp. When they occur they slightly improved with Benadryl only for 2-3 hours, they are interfering with sleep. She denies any rash or skin changes at the sites of itching. Husband who lives with her denies any symptoms himself. She denies any new medications or changes in doses to coincide with onset of symptoms. She denies nasal congestion, wheezing, cough, shortness of breath, flushing or diarrhea  Patient is requesting refill on Humalog which she is taking with 3 meals a day. Fasting blood sugars are unknown, two-hour postprandial sugars just below 180.    Review Of Systems Outlined In HPI  Past Medical History  Diagnosis Date  . Rectal cancer     radiation, chemo, surgery 2001  . HTN (hypertension)   . DM (diabetes mellitus)   . Hyperlipidemia   . Hypothyroidism   . Obesity   . Breast cancer   . DVT (deep venous thrombosis)   . Renal insufficiency   . Back pain   . CAD (coronary artery disease)   . CVA (cerebral infarction)      Family History  Problem Relation Age of Onset  . Heart failure Mother   . Other Mother     CHF  . COPD Father   . Other Father     CHF  . Diabetes    . Coronary artery disease    . Diabetes Sister   . Coronary artery disease Sister   . Diabetes Brother   . Coronary artery disease Brother      History  Substance Use Topics  . Smoking status: Never Smoker   . Smokeless tobacco: Not on file  . Alcohol Use: Yes     Comment: rare     Objective: Filed Vitals:   11/26/12 1103  BP: 180/90    General: Alert and Oriented, No Acute Distress HEENT: Pupils equal, round, reactive to light. Conjunctivae clear.   moist mucous  membranes pharynx unremarkable  Lungs: Clear to auscultation bilaterally, no wheezing/ronchi/rales.  Comfortable work of breathing. Good air movement. Cardiac: Regular rate and rhythm. Normal S1/S2.  No murmurs, rubs, nor gallops.   Extremities: No peripheral edema.  Strong peripheral pulses.  Mental Status: No depression, anxiety, nor agitation. Skin: Warm and dry. Skin on the torso upper extremity and lower extremity is unremarkable without gross abnormalities  Assessment & Plan: [name] was seen today for itching all over.  Diagnoses and associated orders for this visit:  Uremic pruritus - hydrOXYzine (ATARAX/VISTARIL) 50 MG tablet; 1-2 by mouth every evening as needed for itching.  Type 2 diabetes mellitus - insulin lispro (HUMALOG) 100 UNIT/ML SOPN; 5 Units SQ with three meals a day, may adjust based on sliding scale.    Quite suspicious of uremic pruritis, stop benadryl and instead start hydroxyzine.  She has tolerated gabapentin in the past which we could always add it if hydroxyzine is not beneficial, there is a report that Zoloft will help this however she is re: taking Zoloft. She will share her symptoms with her dialysis team at her next dialysis session on Friday. Type 2 diabetes: Mealtime insulin controlled continue Humalog samples provided along with prescription  Return in about 4  weeks (around 12/24/2012).

## 2012-12-09 ENCOUNTER — Telehealth: Payer: Self-pay | Admitting: Family Medicine

## 2012-12-09 DIAGNOSIS — Z7189 Other specified counseling: Secondary | ICD-10-CM

## 2012-12-09 NOTE — Telephone Encounter (Signed)
Ostomy referral ?

## 2012-12-24 ENCOUNTER — Ambulatory Visit: Payer: Medicare Other | Admitting: Family Medicine

## 2012-12-30 ENCOUNTER — Encounter: Payer: Self-pay | Admitting: Family Medicine

## 2012-12-30 ENCOUNTER — Ambulatory Visit (INDEPENDENT_AMBULATORY_CARE_PROVIDER_SITE_OTHER): Payer: Medicare Other | Admitting: Family Medicine

## 2012-12-30 VITALS — BP 126/70 | HR 48 | Wt 173.0 lb

## 2012-12-30 DIAGNOSIS — M25562 Pain in left knee: Secondary | ICD-10-CM

## 2012-12-30 DIAGNOSIS — M25569 Pain in unspecified knee: Secondary | ICD-10-CM

## 2012-12-30 NOTE — Progress Notes (Signed)
CC: [name] is a 72 y.o. female is here for left leg pain   Subjective: HPI:  Followup left knee pain: She reports chronic left knee pain that has responded well to steroid injections in the past however she had a stumble 2 weeks ago was seen by her orthopedist and given a steroid injection into left knee a little less than 2 weeks ago. Pain was completely absent until 3 days ago when it returned. Described only as pain localized in the front of the knee nonradiating and mild in severity. Denies swelling redness or warmth of the knee. Denies locking catching or giving way.  She tells me that all she wants to know is whether or not she should go back to get another steroid injection.  She reports that wound care is visiting her approximately twice a week and already helping with hardware and set up of her ostomy.   Review Of Systems Outlined In HPI  Past Medical History  Diagnosis Date  . Rectal cancer     radiation, chemo, surgery 2001  . HTN (hypertension)   . DM (diabetes mellitus)   . Hyperlipidemia   . Hypothyroidism   . Obesity   . Breast cancer   . DVT (deep venous thrombosis)   . Renal insufficiency   . Back pain   . CAD (coronary artery disease)   . CVA (cerebral infarction)      Family History  Problem Relation Age of Onset  . Heart failure Mother   . Other Mother     CHF  . COPD Father   . Other Father     CHF  . Diabetes    . Coronary artery disease    . Diabetes Sister   . Coronary artery disease Sister   . Diabetes Brother   . Coronary artery disease Brother      History  Substance Use Topics  . Smoking status: Never Smoker   . Smokeless tobacco: Not on file  . Alcohol Use: Yes     Comment: rare     Objective: Filed Vitals:   12/30/12 1318  BP: 126/70  Pulse: 48    Vital signs reviewed. General: Alert and Oriented, No Acute Distress HEENT: Pupils equal, round, reactive to light. Conjunctivae clear.  External ears unremarkable.  Moist  mucous membranes. Lungs: Clear and comfortable work of breathing, speaking in full sentences without accessory muscle use. Cardiac: Regular rate and rhythm.  Neuro: CN II-XII grossly intact, gait normal. Extremities: No peripheral edema.  Strong peripheral pulses.  Declines left knee exam Mental Status: No depression, anxiety, nor agitation. Logical though process. Skin: Warm and dry.  Assessment & Plan: [name] was seen today for left leg pain.  Diagnoses and associated orders for this visit:  Left knee pain    Left knee pain: Uncontrolled discussed I could offer her oral medication to help manage her left knee pain however I don't think it be wise to get another steroid injection until 3 months after the most recent one. Offered referral to sports medicine for consideration of viscous supplementation, she states she is not sure about any of these recommendations and will continue to think about options.  Return if symptoms worsen or fail to improve.

## 2013-01-27 ENCOUNTER — Telehealth: Payer: Self-pay | Admitting: Family Medicine

## 2013-01-27 DIAGNOSIS — E119 Type 2 diabetes mellitus without complications: Secondary | ICD-10-CM

## 2013-01-27 MED ORDER — INSULIN LISPRO 100 UNIT/ML (KWIKPEN): PEN_INJECTOR | SUBCUTANEOUS | Status: DC

## 2013-01-27 NOTE — Telephone Encounter (Signed)
req refill on insulin

## 2013-02-05 ENCOUNTER — Encounter: Payer: Self-pay | Admitting: Family Medicine

## 2013-02-06 ENCOUNTER — Telehealth (INDEPENDENT_AMBULATORY_CARE_PROVIDER_SITE_OTHER): Payer: Medicare Other | Admitting: *Deleted

## 2013-02-06 DIAGNOSIS — N898 Other specified noninflammatory disorders of vagina: Secondary | ICD-10-CM

## 2013-02-06 DIAGNOSIS — R300 Dysuria: Secondary | ICD-10-CM

## 2013-02-06 LAB — POCT URINALYSIS DIPSTICK
Bilirubin, UA: NEGATIVE
Glucose, UA: NEGATIVE
Nitrite, UA: NEGATIVE
Spec Grav, UA: 1.025
Urobilinogen, UA: 0.2
pH, UA: 6

## 2013-02-06 NOTE — Telephone Encounter (Signed)
Pt's daughter dropped off urine specimen for pt. The daughter was seen in the office earlier this week and states that her mom had been having some dysuria. Called and spoke with pt and she states she has had vaginal itching since this past sat. Pt denies pain or burning or any other sxs. I did advise her that based on results she may need an appt or if medication was rx'ed and it didn't clear up she may need an appt.Urine culture sent also

## 2013-02-06 NOTE — Telephone Encounter (Signed)
Pt.notified

## 2013-02-06 NOTE — Telephone Encounter (Signed)
Seth Bake, Will you please let [name] know that her initial urine test did not show any strong indicators for a UTI (she's had blood in urine in the past) so we'll let a culture grow over the weekend and will give her an update on if ABX would be of any benefit once it's back.

## 2013-02-09 ENCOUNTER — Telehealth: Payer: Self-pay | Admitting: Family Medicine

## 2013-02-09 LAB — URINE CULTURE: Colony Count: >=100000

## 2013-02-09 MED ORDER — CIPROFLOXACIN HCL 250 MG PO TABS: ORAL_TABLET | ORAL | Status: DC

## 2013-02-09 NOTE — Telephone Encounter (Signed)
Left message on vm for pt 

## 2013-02-09 NOTE — Telephone Encounter (Signed)
Andre,a Will you please let Mrs. Cupit know that her urine grew out a bacteria that should be senstivite to cipro which i've sent to her [facility].

## 2013-02-24 ENCOUNTER — Ambulatory Visit (INDEPENDENT_AMBULATORY_CARE_PROVIDER_SITE_OTHER): Payer: Medicare Other | Admitting: Family Medicine

## 2013-02-24 ENCOUNTER — Encounter: Payer: Self-pay | Admitting: Family Medicine

## 2013-02-24 VITALS — BP 136/62 | HR 63 | Wt 177.0 lb

## 2013-02-24 DIAGNOSIS — F329 Major depressive disorder, single episode, unspecified: Secondary | ICD-10-CM | POA: Insufficient documentation

## 2013-02-24 DIAGNOSIS — M25569 Pain in unspecified knee: Secondary | ICD-10-CM

## 2013-02-24 DIAGNOSIS — M25561 Pain in right knee: Secondary | ICD-10-CM

## 2013-02-24 DIAGNOSIS — F411 Generalized anxiety disorder: Secondary | ICD-10-CM

## 2013-02-24 DIAGNOSIS — F419 Anxiety disorder, unspecified: Secondary | ICD-10-CM

## 2013-02-24 DIAGNOSIS — F418 Other specified anxiety disorders: Secondary | ICD-10-CM

## 2013-02-24 DIAGNOSIS — M533 Sacrococcygeal disorders, not elsewhere classified: Secondary | ICD-10-CM

## 2013-02-24 DIAGNOSIS — E039 Hypothyroidism, unspecified: Secondary | ICD-10-CM

## 2013-02-24 DIAGNOSIS — E119 Type 2 diabetes mellitus without complications: Secondary | ICD-10-CM

## 2013-02-24 MED ORDER — LIDOCAINE HCL 1 % IJ SOLN
3.0000 mL | Freq: Once | INTRAMUSCULAR | Status: AC
  Administered 2013-02-24: 3 mL

## 2013-02-24 MED ORDER — DIAZEPAM 5 MG PO TABS: ORAL_TABLET | ORAL | Status: AC

## 2013-02-24 MED ORDER — TRIAMCINOLONE ACETONIDE 40 MG/ML IJ SUSP
80.0000 mg | Freq: Once | INTRAMUSCULAR | Status: AC
  Administered 2013-02-24: 80 mg via INTRA_ARTICULAR

## 2013-02-24 NOTE — Progress Notes (Signed)
CC: [name] is a 73 y.o. female is here for right knee pain   Subjective: HPI:  Patient complains of worsening right knee pain it is described only has pain moderate in severity worse at the end of the day or after standing for longer than 5 minutes. Is described as deep in the knee and not radiating. Pain is identical in character and severity to that which she experienced greater than a year ago which would respond to intra-articular steroid injections. She denies catching locking or giving way. Denies swelling redness or warmth of the joint. She is specifically asking if she could receive another steroid injection is been greater than a year since the last one.  Complains of continued and unchanging tailbone pain described only has pain which is nonradiating localized where her rectum used to be. Absent when standing however moderate severity when sitting on the tailbone it is drastically relieved with sitting forward on her initial tuberosities. No benefit from using doughnut cushion. Nothing else makes better or worse other than above it has been present on a daily basis for the past 6 months  She has a history of hypothyroidism for which she is taking levothyroxine 50 migraines on a daily basis. Denies unintentional weight gain or loss. she does admit to anxiety however this is mostly when she is at dialysis, it's bad enough to the point where she gets snappy and irritable with the staff helping her. There's been no recent hair or skin changes it's been much greater than 3 months since her last TSH check.  Followup type 2 diabetes: Continues on lispro and Lantus specifically 30 units every evening. She has not had her A1c checked in over 6 months it may have been up to a year she's unsure what it was recently checked and what her past values have been.     Review Of Systems Outlined In HPI  Past Medical History  Diagnosis Date  . Rectal cancer     radiation, chemo, surgery 2001  .  HTN (hypertension)   . DM (diabetes mellitus)   . Hyperlipidemia   . Hypothyroidism   . Obesity   . Breast cancer   . DVT (deep venous thrombosis)   . Renal insufficiency   . Back pain   . CAD (coronary artery disease)   . CVA (cerebral infarction)     Past Surgical History  Procedure Laterality Date  . Gallbladder surgery    . Appendectomy    . Breast lumpectomy    . Ankle surgery    . Abdominal hysterectomy     Family History  Problem Relation Age of Onset  . Heart failure Mother   . Other Mother     CHF  . COPD Father   . Other Father     CHF  . Diabetes    . Coronary artery disease    . Diabetes Sister   . Coronary artery disease Sister   . Diabetes Brother   . Coronary artery disease Brother     History   Social History  . Marital Status: Married    Spouse Name: N/A    Number of Children: N/A  . Years of Education: N/A   Occupational History  . Not on file.   Social History Main Topics  . Smoking status: Never Smoker   . Smokeless tobacco: Not on file  . Alcohol Use: Yes     Comment: rare  . Drug Use: Not on file  . Sexual  Activity: Not on file   Other Topics Concern  . Not on file   Social History Narrative   Retried from medical billing.   Finished HS.   Married to [place].    Has 3 kids.   Never smoked.   Rare ETOH.   No regular exericise.           Objective: BP 136/62  Pulse 63  Wt 177 lb (80.287 kg)  General: Alert and Oriented, No Acute Distress HEENT: Pupils equal, round, reactive to light. Conjunctivae clear.  Moist mucous membranes pharynx unremarkable Lungs: Clear to auscultation bilaterally, no wheezing/ronchi/rales.  Comfortable work of breathing. Good air movement. Cardiac: Regular rate and rhythm. Normal S1/S2.  No murmurs, rubs, nor gallops.   Abdomen: Obese soft nontender Back: Her subjective tailbone pain is reproduced with palpation of the coccyx however pain is also elicited with palpation of medial posterior ischial  tuberosities Extremities: No peripheral edema.  Strong peripheral pulses. Right knee exam shows full-strength and range of motion. There is no swelling, redness, nor warmth overlying the knee.  Mild patellar crepitus No patellar apprehension. No pain with palpation of the inferior patellar pole.  No pain or laxity with valgus nor varus stress. Anterior drawer is negative. McMurray's negative. No popliteal space tenderness or palpable mass. There is moderate medial and lateral joint line tenderness to palpation. Mental Status: No depression, anxiety, nor agitation. Skin: Warm and dry. Skin overlying her coccyx is unremarkable, her rectum has been closed  Assessment & Plan: [name] was seen today for right knee pain.  Diagnoses and associated orders for this visit:  Type 2 diabetes mellitus - Hemoglobin A1c  Hypothyroid - TSH  Right knee pain  Coccyx pain  Situational anxiety  Other Orders - diazepam (VALIUM) 5 MG tablet; Half tab on dialysis days as needed for anxiety.    Type 2 diabetes: Well overdue for A1c at given her lab orders to have this drawn at her next dialysis session Right knee pain: Per her request intra-articular steroid injection was delivered without complication or difficulty Coccyx pain: Will refer to Dr. Darene Lamer. in sports medicine for evaluation of steroid injections, I cannot 100% confirmed that her pain is being generated from the coccyx alone Situational anxiety: She requests medication to help as needed for dialysis sessions with regard to anxiety therefore start low-dose Valium as needed since her husband provides transportation Hypothyroidism: Clinically controlled however due for repeat TSH  40 minutes spent face-to-face during visit today of which at least 50% was counseling or coordinating care regarding: 1. Right knee pain   2. Type 2 diabetes mellitus   3. Hypothyroid   4. Coccyx pain   5. Situational anxiety      Return in about 3 months (around  05/24/2013).  Knee Injection Procedure Note  Pre-operative Diagnosis: right knee pain -OA  Post-operative Diagnosis: same  Indications: pain  Anesthesia: topical cold spray  Procedure Details   Verbal consent was obtained for the procedure. The joint was prepped with topical cols spray. A 22 gauge needle was inserted into the inferior lateral aproach. . 2 ml 1% lidocaine and 2 ml of triamcinolone (KENALOG) 40mg /ml was then injected into the joint through the same needle. The needle was removed and the area cleansed and dressed.  Complications:  None; patient tolerated the procedure well.

## 2013-02-26 ENCOUNTER — Telehealth: Payer: Self-pay | Admitting: Family Medicine

## 2013-02-26 ENCOUNTER — Other Ambulatory Visit: Payer: Self-pay | Admitting: *Deleted

## 2013-02-26 MED ORDER — SERTRALINE HCL 100 MG PO TABS: 100.0000 mg | ORAL_TABLET | Freq: Every day | ORAL | Status: DC

## 2013-02-26 MED ORDER — HYDRALAZINE HCL 50 MG PO TABS: 50.0000 mg | ORAL_TABLET | Freq: Two times a day (BID) | ORAL | Status: AC

## 2013-02-26 MED ORDER — DOXEPIN HCL 10 MG PO CAPS: 10.0000 mg | ORAL_CAPSULE | Freq: Every day | ORAL | Status: AC

## 2013-02-26 MED ORDER — NEPHRO-VITE 0.8 MG PO TABS: 1.0000 | ORAL_TABLET | Freq: Every day | ORAL | Status: AC

## 2013-02-26 NOTE — Telephone Encounter (Signed)
Seth Bake, Can you please clarify nephrovite dose and frequency with patient, basically what she's currently taking.  Whatever it is can you then send an Rx to [facility] in [place]?

## 2013-02-26 NOTE — Telephone Encounter (Signed)
Called [facility], the previous pharm she had this filled at and they verified that she last picked up rx for 0.8 mcg.The last time this was filled there was one year ago Pt didn't know what she was taking and didn't.

## 2013-02-27 ENCOUNTER — Ambulatory Visit (INDEPENDENT_AMBULATORY_CARE_PROVIDER_SITE_OTHER): Payer: Medicare Other | Admitting: Sports Medicine

## 2013-02-27 ENCOUNTER — Encounter: Payer: Self-pay | Admitting: Sports Medicine

## 2013-02-27 VITALS — BP 147/62 | HR 64 | Ht 62.0 in | Wt 174.0 lb

## 2013-02-27 DIAGNOSIS — M545 Low back pain, unspecified: Secondary | ICD-10-CM

## 2013-02-27 DIAGNOSIS — F418 Other specified anxiety disorders: Secondary | ICD-10-CM

## 2013-02-27 DIAGNOSIS — M170 Bilateral primary osteoarthritis of knee: Secondary | ICD-10-CM

## 2013-02-27 DIAGNOSIS — IMO0001 Reserved for inherently not codable concepts without codable children: Secondary | ICD-10-CM

## 2013-02-27 DIAGNOSIS — M1710 Unilateral primary osteoarthritis, unspecified knee: Secondary | ICD-10-CM

## 2013-02-27 DIAGNOSIS — F411 Generalized anxiety disorder: Secondary | ICD-10-CM

## 2013-02-27 DIAGNOSIS — IMO0002 Reserved for concepts with insufficient information to code with codable children: Secondary | ICD-10-CM

## 2013-02-27 DIAGNOSIS — M25559 Pain in unspecified hip: Secondary | ICD-10-CM

## 2013-02-27 MED ORDER — DOXYCYCLINE HYCLATE 100 MG PO TABS: 100.0000 mg | ORAL_TABLET | Freq: Two times a day (BID) | ORAL | Status: AC

## 2013-02-27 MED ORDER — VENLAFAXINE HCL ER 75 MG PO CP24: 75.0000 mg | ORAL_CAPSULE | Freq: Every day | ORAL | Status: DC

## 2013-02-27 NOTE — Addendum Note (Signed)
Addended by: Silverio Decamp on: 02/27/2013 05:17 PM   Modules accepted: Level of Service

## 2013-02-27 NOTE — Assessment & Plan Note (Signed)
Right knee injection by Dr. Ileene Rubens several days ago, unfortunately she continues to have pain. She tells me she has been through viscosupplementation as well as multiple injections, and overall continues to have pain. We will discuss this at a future visit, I do want some baseline x-rays.

## 2013-02-27 NOTE — Progress Notes (Addendum)
Subjective:    I'm seeing this patient as a consultation for:  Dr. Ileene Rubens  CC: Low back pain, tailbone pain  HPI: This is a pleasant 73 year old female with multiple medical problems who comes in with a long history of pain that she localizes over her tailbone. It is worse when laying flat on her back. Symptoms are moderate, persistent, they're not associated with Valsalva and are not worse with riding in a car, no radicular symptoms.  Right knee osteoarthritis: Injection performed several days ago, patient tells me that she has already been through multiple injections as well as Visco supplementation, it has been recommended that she have a knee replacement, she continues to have pain localized predominately at the medial joint line.  Past medical history, Surgical history, Family history not pertinant except as noted below, Social history, Allergies, and medications have been entered into the medical record, reviewed, and no changes needed.   Review of Systems: No headache, visual changes, nausea, vomiting, diarrhea, constipation, dizziness, abdominal pain, skin rash, fevers, chills, night sweats, weight loss, swollen lymph nodes, body aches, joint swelling, muscle aches, chest pain, shortness of breath, mood changes, visual or auditory hallucinations.   Objective:   General: Well Developed, well nourished, and in no acute distress.  Neuro/Psych: Alert and oriented x3, extra-ocular muscles intact, able to move all 4 extremities, sensation grossly intact. Skin: Warm and dry, no rashes noted.  Respiratory: Not using accessory muscles, speaking in full sentences, trachea midline.  Cardiovascular: Pulses palpable, no extremity edema. Abdomen: Does not appear distended. Right knee: Tender to palpation at the medial joint line without any swelling or erythema. Low back: There is a discrete area of skin breakdown over the tailbone with mild erythema. This has the appearance of a stage I  decubitus ulcer. She also has exquisite tenderness to palpation over both sacroiliac joints. There is also areas of tenderness to palpation along the midline of the lumbar spine.  Procedure: Real-time Ultrasound Guided Injection of left sacroiliac joint Device: GE Logiq E  Verbal informed consent obtained.  Time-out conducted.  Noted no overlying erythema, induration, or other signs of local infection.  Skin prepped in a sterile fashion.  Local anesthesia: Topical Ethyl chloride.  With sterile technique and under real time ultrasound guidance:  Spinal needle advanced into the left sacroiliac joint, concordant pain was noted with injection of 1 cc Kenalog 40, 4 cc lidocaine. Completed without difficulty  Pain immediately resolved suggesting accurate placement of the medication.  Advised to call if fevers/chills, erythema, induration, drainage, or persistent bleeding.  Images permanently stored and available for review in the ultrasound unit.  Impression: Technically successful ultrasound guided injection.  Procedure: Real-time Ultrasound Guided Injection of right sacroiliac joint Device: GE Logiq E  Verbal informed consent obtained.  Time-out conducted.  Noted no overlying erythema, induration, or other signs of local infection.  Skin prepped in a sterile fashion.  Local anesthesia: Topical Ethyl chloride.  With sterile technique and under real time ultrasound guidance:  Spinal needle advanced into the left sacroiliac joint, concordant pain was noted with injection of 1 cc Kenalog 40, 4 cc lidocaine. Completed without difficulty  Pain immediately resolved suggesting accurate placement of the medication.  Advised to call if fevers/chills, erythema, induration, drainage, or persistent bleeding.  Images permanently stored and available for review in the ultrasound unit.  Impression: Technically successful ultrasound guided injection.  Impression and Recommendations:   This case required  medical decision making of moderate complexity.  I  spent 40 minutes with this patient, greater than 50% was face-to-face time counseling regarding the below diagnoses.

## 2013-02-27 NOTE — Assessment & Plan Note (Addendum)
Pain is localized bilaterally over the sacroiliac joints. Bilateral sacroiliac joint injection as above. She does have some pain over her tailbone near the coccyx however there is also some erythema and skin breakdown here as well as what I think is mild infection, and likely a stage I decubitus ulcer. I'm going to add doxycycline, and we are going to obtain a CT scan of her pelvis with concentration of the sacrum/coccyx and overlying soft tissues.

## 2013-02-27 NOTE — Assessment & Plan Note (Addendum)
Some of her pain is likely also myofascial. I am going to down taper her Zoloft and we are going to switch to Effexor for it's noradrenegeric effect to help block her pain.. She can follow this up with Dr. Ileene Rubens.

## 2013-03-02 ENCOUNTER — Ambulatory Visit (INDEPENDENT_AMBULATORY_CARE_PROVIDER_SITE_OTHER): Payer: Medicare Other

## 2013-03-02 DIAGNOSIS — M545 Low back pain, unspecified: Secondary | ICD-10-CM

## 2013-03-02 DIAGNOSIS — M170 Bilateral primary osteoarthritis of knee: Secondary | ICD-10-CM

## 2013-03-02 DIAGNOSIS — Z85048 Personal history of other malignant neoplasm of rectum, rectosigmoid junction, and anus: Secondary | ICD-10-CM

## 2013-03-02 DIAGNOSIS — R935 Abnormal findings on diagnostic imaging of other abdominal regions, including retroperitoneum: Secondary | ICD-10-CM

## 2013-03-02 DIAGNOSIS — I7090 Unspecified atherosclerosis: Secondary | ICD-10-CM

## 2013-03-02 DIAGNOSIS — M25569 Pain in unspecified knee: Secondary | ICD-10-CM

## 2013-03-02 DIAGNOSIS — N949 Unspecified condition associated with female genital organs and menstrual cycle: Secondary | ICD-10-CM

## 2013-03-02 DIAGNOSIS — M25559 Pain in unspecified hip: Secondary | ICD-10-CM

## 2013-03-04 DIAGNOSIS — N186 End stage renal disease: Secondary | ICD-10-CM

## 2013-03-04 DIAGNOSIS — IMO0002 Reserved for concepts with insufficient information to code with codable children: Secondary | ICD-10-CM

## 2013-03-04 DIAGNOSIS — I120 Hypertensive chronic kidney disease with stage 5 chronic kidney disease or end stage renal disease: Secondary | ICD-10-CM

## 2013-03-04 DIAGNOSIS — L299 Pruritus, unspecified: Secondary | ICD-10-CM

## 2013-03-11 ENCOUNTER — Encounter: Payer: Self-pay | Admitting: Family Medicine

## 2013-03-11 ENCOUNTER — Telehealth: Payer: Self-pay | Admitting: *Deleted

## 2013-03-11 ENCOUNTER — Ambulatory Visit (INDEPENDENT_AMBULATORY_CARE_PROVIDER_SITE_OTHER): Payer: Medicare Other | Admitting: Family Medicine

## 2013-03-11 VITALS — BP 145/72 | HR 86 | Temp 98.9°F

## 2013-03-11 DIAGNOSIS — B9689 Other specified bacterial agents as the cause of diseases classified elsewhere: Secondary | ICD-10-CM

## 2013-03-11 DIAGNOSIS — A499 Bacterial infection, unspecified: Secondary | ICD-10-CM

## 2013-03-11 DIAGNOSIS — J329 Chronic sinusitis, unspecified: Secondary | ICD-10-CM

## 2013-03-11 MED ORDER — CLONIDINE HCL 0.1 MG PO TABS: 0.1000 mg | ORAL_TABLET | Freq: Two times a day (BID) | ORAL | Status: DC

## 2013-03-11 MED ORDER — LEVOFLOXACIN 250 MG PO TABS: ORAL_TABLET | ORAL | Status: DC

## 2013-03-11 MED ORDER — AMOXICILLIN-POT CLAVULANATE 500-125 MG PO TABS: ORAL_TABLET | ORAL | Status: AC

## 2013-03-11 NOTE — Progress Notes (Signed)
Correction, Levaquin will not be filled by [facility] due to potential for arrhythmia instead renally dosed Augmentin has been prescribed for bacterial sinusitis

## 2013-03-11 NOTE — Progress Notes (Signed)
CC: [name] is a 73 y.o. female is here for Cough   Subjective: HPI:  Complains of cough and fatigue. Both of which have been present since early this weekend. Cough is described as nonproductive improves with Robitussin-DM present all hours of the day but not interfering with sleep. No blood in sputum it is also without shortness of breath wheezing or chest pain. Fatigue is described as moderate in severity present all hours of the day described as weakness throughout her whole body and a sensation of just wanting to rest all day long. Symptoms have been accompanied by pressure in the face localized to forehead and both cheeks worse with leaning forward or when coughing mild to moderate severity at rest. Accompanied by thick nasal discharge. Denies fevers, chills, confusion, motor or sensory disturbances, focal weakness, rash nor new back pain   Review Of Systems Outlined In HPI  Past Medical History  Diagnosis Date  . Rectal cancer     radiation, chemo, surgery 2001  . HTN (hypertension)   . DM (diabetes mellitus)   . Hyperlipidemia   . Hypothyroidism   . Obesity   . Breast cancer   . DVT (deep venous thrombosis)   . Renal insufficiency   . Back pain   . CAD (coronary artery disease)   . CVA (cerebral infarction)     Past Surgical History  Procedure Laterality Date  . Gallbladder surgery    . Appendectomy    . Breast lumpectomy    . Ankle surgery    . Abdominal hysterectomy     Family History  Problem Relation Age of Onset  . Heart failure Mother   . Other Mother     CHF  . COPD Father   . Other Father     CHF  . Diabetes    . Coronary artery disease    . Diabetes Sister   . Coronary artery disease Sister   . Diabetes Brother   . Coronary artery disease Brother     History   Social History  . Marital Status: Married    Spouse Name: N/A    Number of Children: N/A  . Years of Education: N/A   Occupational History  . Not on file.   Social History Main  Topics  . Smoking status: Never Smoker   . Smokeless tobacco: Not on file  . Alcohol Use: Yes     Comment: rare  . Drug Use: Not on file  . Sexual Activity: Not on file   Other Topics Concern  . Not on file   Social History Narrative   Retried from medical billing.   Finished HS.   Married to [name].    Has 3 kids.   Never smoked.   Rare ETOH.   No regular exericise.           Objective: BP 145/72  Pulse 86  Temp(Src) 98.9 F (37.2 C) (Oral)  SpO2 92%  General: Alert and Oriented, No Acute Distress HEENT: Pupils equal, round, reactive to light. Conjunctivae clear.  External ears unremarkable, canals clear with intact TMs with appropriate landmarks.  Middle ear appears open without effusion. Boggy erythematous inferior turbinates mild mucoid discharge.  Moist mucous membranes, pharynx without inflammation nor lesions however moderate postnasal drip.  Neck supple without palpable lymphadenopathy nor abnormal masses. Lungs: Clear to auscultation bilaterally, no wheezing/ronchi/rales.  Comfortable work of breathing. Good air movement. Cardiac: Regular rate and rhythm. Normal S1/S2.  No murmurs, rubs, nor gallops.  Extremities: No peripheral edema.  Strong peripheral pulses.  Mental Status: No depression, anxiety, nor agitation. Skin: Warm and dry. Skin surrounding Hemodialysis port on the right chest is nontender without erythema  Assessment & Plan: [name] was seen today for cough.  Diagnoses and associated orders for this visit:  Bacterial sinusitis - levofloxacin (LEVAQUIN) 250 MG tablet; Two tablets by mouth at once on day one, then one tablet every 48 hours until bottle finished. (Renally dosed)  Other Orders - cloNIDine (CATAPRES) 0.1 MG tablet; Take 1 tablet (0.1 mg total) by mouth 2 (two) times daily.    Bacterial sinusitis: Start levofloxacin dosed for a hemodialysis patient she is not interested in any medications to help with symptoms as she is satisfied with  Robitussin-DM. She is requesting refill on clonidine  25 minutes spent face-to-face during visit today of which at least 50% was counseling or coordinating care regarding: 1. Bacterial sinusitis      Return if symptoms worsen or fail to improve.

## 2013-03-11 NOTE — Telephone Encounter (Signed)
Thank you for the notification, let's switch this to Augmentin 500/125mg  to be taken once a day by mouth, taken after dialysis on dialysis days.  Dispense #10 with no refills.  Do not fill levaquin.  Seth Bake can you please call this in.

## 2013-03-11 NOTE — Telephone Encounter (Signed)
Pharm called and they want to know if its ok to fill the levoquin. It has a drug interaction with Doxepin which can cause arrythmia and QT prolongation

## 2013-03-11 NOTE — Telephone Encounter (Signed)
rx called in

## 2013-03-13 ENCOUNTER — Ambulatory Visit: Payer: Medicare Other | Admitting: Sports Medicine

## 2013-03-13 ENCOUNTER — Telehealth: Payer: Self-pay | Admitting: *Deleted

## 2013-03-13 NOTE — Telephone Encounter (Signed)
Pt daughter called and states her cough is getting worse and pt still is not feeling well. On the message she wanted to know if her flu test was negative. I called the daughter back [employer]) and told her that we didn't do a flu test since her sxs indicated she had a sinus infection. I did advise her that Dr. Ileene Rubens sent in an abx. The daughter was unaware of this because when she went to go pick up rx there was nothing there for [name] . Apparently the pharm didn't let her know that they had contacted [place] to see about switching her abx because of a possible interaction with one of her other medicaitons. I did let the daughter know that an alt abx had been sent to pharm in place of the previous one. Daughter voiced understanding and states she will go pick rx up

## 2013-03-24 ENCOUNTER — Telehealth: Payer: Self-pay | Admitting: *Deleted

## 2013-03-24 ENCOUNTER — Telehealth: Payer: Self-pay | Admitting: Family Medicine

## 2013-03-24 MED ORDER — SERTRALINE HCL 100 MG PO TABS: 100.0000 mg | ORAL_TABLET | Freq: Every day | ORAL | Status: AC

## 2013-03-24 NOTE — Telephone Encounter (Signed)
Maxium 30 units a day for humalog.

## 2013-03-24 NOTE — Telephone Encounter (Signed)
[name] pharm wants to know the max dose pt can get on her Humalog a day on sliding scale so pharmacist will know how many pens to disp

## 2013-03-24 NOTE — Telephone Encounter (Signed)
refill 

## 2013-03-25 ENCOUNTER — Telehealth: Payer: Self-pay | Admitting: Family Medicine

## 2013-03-25 DIAGNOSIS — E119 Type 2 diabetes mellitus without complications: Secondary | ICD-10-CM

## 2013-03-25 MED ORDER — METOPROLOL TARTRATE 25 MG PO TABS: 25.0000 mg | ORAL_TABLET | Freq: Two times a day (BID) | ORAL | Status: AC

## 2013-03-25 MED ORDER — LEVOTHYROXINE SODIUM 112 MCG PO TABS: 112.0000 ug | ORAL_TABLET | Freq: Every day | ORAL | Status: AC

## 2013-03-25 MED ORDER — PRAVASTATIN SODIUM 40 MG PO TABS
40.0000 mg | ORAL_TABLET | Freq: Every day | ORAL | Status: AC
Start: 2013-03-25 — End: ?

## 2013-03-25 MED ORDER — INSULIN LISPRO 100 UNIT/ML (KWIKPEN): PEN_INJECTOR | SUBCUTANEOUS | Status: AC

## 2013-03-25 MED ORDER — ISOSORBIDE MONONITRATE ER 30 MG PO TB24: 30.0000 mg | ORAL_TABLET | Freq: Every day | ORAL | Status: AC

## 2013-03-25 MED ORDER — INSULIN GLARGINE 100 UNIT/ML SOLOSTAR PEN: 30.0000 [IU] | PEN_INJECTOR | Freq: Every day | SUBCUTANEOUS | Status: DC

## 2013-03-25 NOTE — Telephone Encounter (Signed)
Called again and line was busy

## 2013-03-25 NOTE — Telephone Encounter (Signed)
Refill req 

## 2013-03-25 NOTE — Telephone Encounter (Signed)
I've tried to call [facility] several times this am and the line is busy.[name]

## 2013-03-25 NOTE — Telephone Encounter (Signed)
Ref req

## 2013-03-25 NOTE — Addendum Note (Signed)
Addended by: Marcial Pacas on: 03/25/2013 08:20 AM   Modules accepted: Orders, Medications

## 2013-03-27 NOTE — Telephone Encounter (Signed)
Received a fax for clarification and faxed this in response to pharm request

## 2013-03-28 ENCOUNTER — Encounter: Payer: Self-pay | Admitting: Emergency Medicine

## 2013-03-28 ENCOUNTER — Emergency Department (INDEPENDENT_AMBULATORY_CARE_PROVIDER_SITE_OTHER)
Admission: EM | Admit: 2013-03-28 | Discharge: 2013-03-28 | Disposition: A | Discharge status: Home / Self Care | Payer: Medicare Other | Source: Home / Self Care

## 2013-03-28 DIAGNOSIS — M533 Sacrococcygeal disorders, not elsewhere classified: Secondary | ICD-10-CM

## 2013-03-28 DIAGNOSIS — L8990 Pressure ulcer of unspecified site, unspecified stage: Secondary | ICD-10-CM

## 2013-03-28 DIAGNOSIS — L0390 Cellulitis, unspecified: Secondary | ICD-10-CM

## 2013-03-28 NOTE — ED Provider Notes (Signed)
CSN: [id number]     Arrival date & time 03/28/13  1422 History   None    Chief Complaint  Patient presents with  . Tailbone Pain    x 8 months    HPI Patient presents today with chief complaint of worsening tailbone pain. States she's had tailbone pain over the past several months. Was seen by PCP about 6 months ago for this. A sacral x-ray was obtained there was otherwise negative. Was seen by our sports medicine doctor here with similar symptoms. CT was obtained that was negative for any fracture or pathological issues in the sacral area. Patient was started on doxycycline for soft tissue coverage in the setting of stage I decubitus ulcer. Patient states that symptoms mildly improved however they have seemed to recur. Based on history of insulin-dependent diabetes as well as end-stage renal disease on dialysis. No fevers or chills.  Past Medical History  Diagnosis Date  . Rectal cancer     radiation, chemo, surgery 2001  . HTN (hypertension)   . DM (diabetes mellitus)   . Hyperlipidemia   . Hypothyroidism   . Obesity   . Breast cancer   . DVT (deep venous thrombosis)   . Renal insufficiency   . Back pain   . CAD (coronary artery disease)   . CVA (cerebral infarction)    Past Surgical History  Procedure Laterality Date  . Gallbladder surgery    . Appendectomy    . Breast lumpectomy    . Ankle surgery    . Abdominal hysterectomy     Family History  Problem Relation Age of Onset  . Heart failure Mother   . Other Mother     CHF  . COPD Father   . Other Father     CHF  . Diabetes    . Coronary artery disease    . Diabetes Sister   . Coronary artery disease Sister   . Diabetes Brother   . Coronary artery disease Brother    History  Substance Use Topics  . Smoking status: Never Smoker   . Smokeless tobacco: Not on file  . Alcohol Use: Yes     Comment: rare   OB History   Grav Para Term Preterm Abortions TAB SAB Ect Mult Living                 Review of  Systems  All other systems reviewed and are negative.    Allergies  Ambien and Contrast media  Home Medications   Current Outpatient Rx  Name  Route  Sig  Dispense  Refill  . AMBULATORY NON FORMULARY MEDICATION      Donut cushion.  One unit, use only when seated to avoid tailbone pain.  Dx: Sacral Contusion   1 Units   0   . amLODipine (NORVASC) 10 MG tablet   Oral   Take 1 tablet (10 mg total) by mouth daily.   30 tablet   0   . aspirin 81 MG tablet   Oral   Take 81 mg by mouth daily.           Marland Kitchen b complex-vitamin c-folic acid (NEPHRO-VITE) 0.8 MG TABS tablet   Oral   Take 1 tablet by mouth at bedtime.   30 tablet   2   . cloNIDine (CATAPRES) 0.1 MG tablet   Oral   Take 1 tablet (0.1 mg total) by mouth 2 (two) times daily.   60 tablet   4   .  diazepam (VALIUM) 5 MG tablet      Half tab on dialysis days as needed for anxiety.   30 tablet   0   . doxepin (SINEQUAN) 10 MG capsule   Oral   Take 1 capsule (10 mg total) by mouth at bedtime.   90 capsule   1   . folic acid (FOLVITE) 1 MG tablet   Oral   Take 1 mg by mouth daily.         . hydrALAZINE (APRESOLINE) 50 MG tablet   Oral   Take 1 tablet (50 mg total) by mouth 2 (two) times daily.   180 tablet   1   . hydrOXYzine (ATARAX/VISTARIL) 50 MG tablet      1-2 by mouth every evening as needed for itching.   60 tablet   0   . Insulin Glargine (LANTUS) 100 UNIT/ML Solostar Pen   Subcutaneous   Inject 30-40 Units into the skin daily at 10 pm.   15 mL   11   . insulin lispro (HUMALOG) 100 UNIT/ML KiwkPen      5 Units SQ with three meals a day, may adjust based on sliding scale. Max 30 Units a day.   5 pen   11   . Insulin Pen Needle 31G X 8 MM MISC      Use a new needle with each time insulin is given .    50 each   0   . isosorbide mononitrate (IMDUR) 30 MG 24 hr tablet   Oral   Take 1 tablet (30 mg total) by mouth daily.   90 tablet   3   . levothyroxine (SYNTHROID,  LEVOTHROID) 112 MCG tablet   Oral   Take 1 tablet (112 mcg total) by mouth daily.   90 tablet   3   . metoprolol tartrate (LOPRESSOR) 25 MG tablet   Oral   Take 1 tablet (25 mg total) by mouth 2 (two) times daily.   60 tablet   5   . pravastatin (PRAVACHOL) 40 MG tablet   Oral   Take 1 tablet (40 mg total) by mouth daily.   90 tablet   3   . sertraline (ZOLOFT) 100 MG tablet   Oral   Take 1 tablet (100 mg total) by mouth daily.   90 tablet   1   . venlafaxine XR (EFFEXOR XR) 75 MG 24 hr capsule   Oral   Take 1 capsule (75 mg total) by mouth daily with breakfast.   30 capsule   1    There were no vitals taken for this visit. Physical Exam  Constitutional:  Obese  HENT:  Head: Normocephalic and atraumatic.  Eyes: Conjunctivae are normal. Pupils are equal, round, and reactive to light.  Neck: Normal range of motion. Neck supple.  Cardiovascular: Normal rate and regular rhythm.   Pulmonary/Chest: Effort normal.  Abdominal: Soft. Bowel sounds are normal.  Musculoskeletal:       Back:    ED Course  Procedures (including critical care time) Labs Review Labs Reviewed - No data to display Imaging Review No results found.   MDM   1. Sacral pain   2. Decubitus ulcer   3. Cellulitis    Given based on insulin-dependent diabetes and end-stage renal disease, higher concern for decubitus ulcer with progressive cellulitis status post recent course of antibiotics. Had a lengthy discussion with patient and husband. Patient will go to the ER for further evaluation as she may  benefit from IV antibiotics and further evaluation of wound. Afebrile today. Hemodynamically stable We'll also likely need outpatient wound care. Patient feels she is able to go to the hospital on her own power.    The patient and/or caregiver has been counseled thoroughly with regard to treatment plan and/or medications prescribed including dosage, schedule, interactions, rationale for use, and  possible side effects and they verbalize understanding. Diagnoses and expected course of recovery discussed and will return if not improved as expected or if the condition worsens. Patient and/or caregiver verbalized understanding.         Shanda Howells, MD 03/28/13 [phone]

## 2013-03-28 NOTE — ED Notes (Signed)
[name] complains of pain in her buttock for 8 months, worse last couple of days. The pain is constant and is a 9/10 on the pain scale.   She reports an area on her buttock that may be infected. Dr Dianah Field treated her with an antibiotic 3 weeks ago.

## 2013-04-09 ENCOUNTER — Telehealth: Payer: Self-pay | Admitting: *Deleted

## 2013-04-09 ENCOUNTER — Other Ambulatory Visit: Payer: Self-pay | Admitting: *Deleted

## 2013-04-09 MED ORDER — INSULIN GLARGINE 100 UNIT/ML SOLOSTAR PEN: 30.0000 [IU] | PEN_INJECTOR | Freq: Every day | SUBCUTANEOUS | Status: AC

## 2013-04-09 NOTE — Telephone Encounter (Signed)
Pt called and states we sent her lantus to another Dr.'s office..I called her and told her we dont sent rx to other Dr's offices but if she needed her rx to go to another pharm we could send a rx there. Pt would like her lantus to go to [facility]. rx sent.called [facility] and had them canel that one

## 2013-04-13 ENCOUNTER — Other Ambulatory Visit: Payer: Self-pay | Admitting: Sports Medicine

## 2013-04-14 ENCOUNTER — Other Ambulatory Visit: Payer: Self-pay | Admitting: Sports Medicine

## 2013-04-16 ENCOUNTER — Telehealth: Payer: Self-pay | Admitting: Family Medicine

## 2013-04-16 MED ORDER — VENLAFAXINE HCL ER 37.5 MG PO CP24: ORAL_CAPSULE | ORAL | Status: AC

## 2013-04-16 NOTE — Telephone Encounter (Signed)
Refill req 

## 2013-05-02 ENCOUNTER — Other Ambulatory Visit: Payer: Self-pay | Admitting: Family Medicine

## 2013-05-08 ENCOUNTER — Encounter: Payer: Self-pay | Admitting: Family Medicine

## 2013-05-08 ENCOUNTER — Ambulatory Visit (INDEPENDENT_AMBULATORY_CARE_PROVIDER_SITE_OTHER): Payer: Medicare Other | Admitting: Family Medicine

## 2013-05-08 VITALS — BP 146/85 | HR 84 | Temp 97.9°F | Wt 168.0 lb

## 2013-05-08 DIAGNOSIS — R29818 Other symptoms and signs involving the nervous system: Secondary | ICD-10-CM

## 2013-05-08 DIAGNOSIS — IMO0002 Reserved for concepts with insufficient information to code with codable children: Secondary | ICD-10-CM

## 2013-05-08 DIAGNOSIS — B0229 Other postherpetic nervous system involvement: Secondary | ICD-10-CM

## 2013-05-08 DIAGNOSIS — Z872 Personal history of diseases of the skin and subcutaneous tissue: Secondary | ICD-10-CM

## 2013-05-08 DIAGNOSIS — R29898 Other symptoms and signs involving the musculoskeletal system: Secondary | ICD-10-CM

## 2013-05-08 MED ORDER — GABAPENTIN 100 MG PO CAPS: 100.0000 mg | ORAL_CAPSULE | Freq: Every day | ORAL | Status: AC

## 2013-05-08 MED ORDER — LIDOCAINE 5 % EX OINT: 1.0000 "application " | TOPICAL_OINTMENT | CUTANEOUS | Status: AC | PRN

## 2013-05-08 NOTE — Progress Notes (Addendum)
CC: [name] is a 73 y.o. female is here for No chief complaint on file.   Subjective: HPI:  Complains of 3 weeks of worsening bilateral posterior thigh pain. Is described as burning,  worse to the touch more on pressure. Pain is nonradiating. Mild response to hydrocodone. No other interventions as of yet.  Her surgeon has sent her to see [place] and does not believe that this is due to any injury that occurred during surgical removal of her pilonidal cyst, she is awaiting a referral to wound therapy and requests that we also placed a referral in hopes of expediting the process. Denies fevers, chills, nor new motor or sensory disturbances.  Review Of Systems Outlined In HPI  Past Medical History  Diagnosis Date  . Rectal cancer     radiation, chemo, surgery 2001  . HTN (hypertension)   . DM (diabetes mellitus)   . Hyperlipidemia   . Hypothyroidism   . Obesity   . Breast cancer   . DVT (deep venous thrombosis)   . Renal insufficiency   . Back pain   . CAD (coronary artery disease)   . CVA (cerebral infarction)     Past Surgical History  Procedure Laterality Date  . Gallbladder surgery    . Appendectomy    . Breast lumpectomy    . Ankle surgery    . Abdominal hysterectomy     Family History  Problem Relation Age of Onset  . Heart failure Mother   . Other Mother     CHF  . COPD Father   . Other Father     CHF  . Diabetes    . Coronary artery disease    . Diabetes Sister   . Coronary artery disease Sister   . Diabetes Brother   . Coronary artery disease Brother     History   Social History  . Marital Status: Married    Spouse Name: N/A    Number of Children: N/A  . Years of Education: N/A   Occupational History  . Not on file.   Social History Main Topics  . Smoking status: Never Smoker   . Smokeless tobacco: Not on file  . Alcohol Use: Yes     Comment: rare  . Drug Use: Not on file  . Sexual Activity: Not on file   Other Topics Concern  . Not on file    Social History Narrative   Retried from medical billing.   Finished HS.   Married to [name].    Has 3 kids.   Never smoked.   Rare ETOH.   No regular exericise.           Objective: BP 146/85  Pulse 84  Temp(Src) 97.9 F (36.6 C) (Oral)  Wt 168 lb (76.204 kg)  General: Alert and Oriented, No Acute Distress HEENT: Pupils equal, round, reactive to light. Conjunctivae clear.   moist mucous membranes  Cardiac: Regular rate and rhythm. Normal S1/S2.  No murmurs, rubs, nor gallops.   Extremities: No peripheral edema.  Strong peripheral pulses. on the back of the right lower extremity extending from the area of her ischium  distally for approximately 6 inches there is moderate bruising tender to the touch with a width of one inch.  In the same region on the left posterior thigh there is a linear cluster of scabs and superficial ulceration tender to light touch without any sign of bacterial infection.  She has great difficulty transferring from bed to her  chair and rolling over on the examination bed today even with the help of myself and her husband present. Mental Status: No depression, anxiety, nor agitation. Skin: Warm and dry.  Assessment & Plan: [name] was seen today for no specified reason.  Diagnoses and associated orders for this visit:  History of pilonidal cyst - Ambulatory referral to Wound Clinic  Post herpetic neuralgia - gabapentin (NEURONTIN) 100 MG capsule; Take 1 capsule (100 mg total) by mouth at bedtime. - lidocaine (XYLOCAINE) 5 % ointment; Apply 1 application topically as needed.  Chronic wound of head, neck, or trunk - Ambulatory referral to Wound Clinic  Muscular deconditioning    Discussed at the wound on the right posterior thigh appears to be purely bruising however the cluster of ulcerations on her left thigh is suspicious for a shingles outbreak, therefore start lidocaine and low-dose gabapentin keeping her lack of renal function in mind.  Call on  Monday with response.  She's having difficulty with transferring clearly exhibited here in our office today she will likely need PT and occupational therapy in the near future  Return if symptoms worsen or fail to improve.

## 2013-05-13 ENCOUNTER — Telehealth: Payer: Self-pay | Admitting: *Deleted

## 2013-05-13 DIAGNOSIS — R29898 Other symptoms and signs involving the musculoskeletal system: Secondary | ICD-10-CM

## 2013-05-13 NOTE — Telephone Encounter (Signed)
Seth Bake, Referral placed and put in your inbox.

## 2013-05-13 NOTE — Telephone Encounter (Signed)
Pt's daughter called and states she is at a loss as to what to do. She states her mother is progressively getting weaker. She would like to have a referral place for home health so someone can come out there daily

## 2013-05-13 NOTE — Telephone Encounter (Signed)
Left message on daughters vm

## 2013-05-18 ENCOUNTER — Telehealth: Payer: Self-pay | Admitting: *Deleted

## 2013-05-18 NOTE — Telephone Encounter (Signed)
Arville Go called and states pt wants to be set up with [facility] instead. I think she has had care through them in the past

## 2013-06-04 DIAGNOSIS — M6281 Muscle weakness (generalized): Secondary | ICD-10-CM

## 2013-06-04 DIAGNOSIS — N186 End stage renal disease: Secondary | ICD-10-CM

## 2013-06-04 DIAGNOSIS — Z5189 Encounter for other specified aftercare: Secondary | ICD-10-CM

## 2013-06-04 DIAGNOSIS — I120 Hypertensive chronic kidney disease with stage 5 chronic kidney disease or end stage renal disease: Secondary | ICD-10-CM

## 2013-06-15 DEATH — deceased

## 2015-09-24 IMAGING — CT CT PELVIS W/O CM
4 of 5 series · 16 of 32 positions shown, 19 images · non-contrast
Comparison: Radiographs dated 09/03/2012

CLINICAL DATA: Pelvic pain in the region of the coccyx. Previous
malignant neoplasm of the rectum.

EXAM:
CT PELVIS WITHOUT CONTRAST
TECHNIQUE: Multidetector CT imaging of the pelvis was performed following the
standard protocol without intravenous contrast.

[Series 3: pelvis standard · axial · 0.67mm/px · z∈[-170,-90]mm · 2 of 93 slices shown, 5 images]
[im 31/93  soft-tissue]
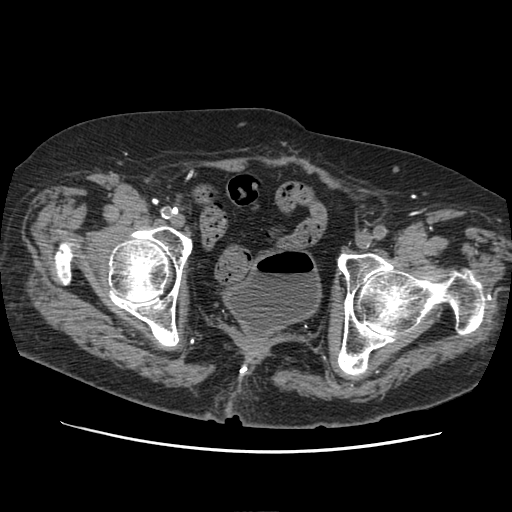
[im 31/93  lung]
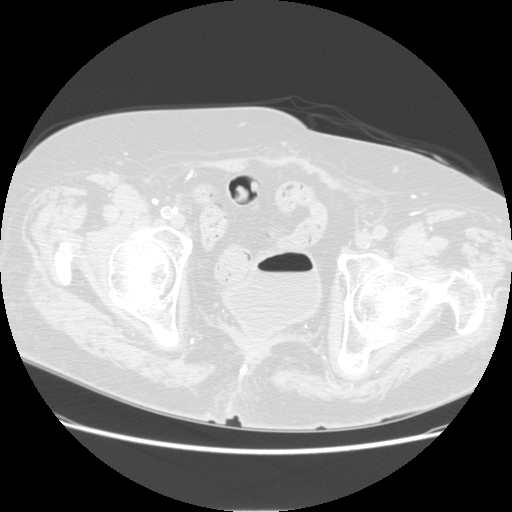
[im 31/93  bone]
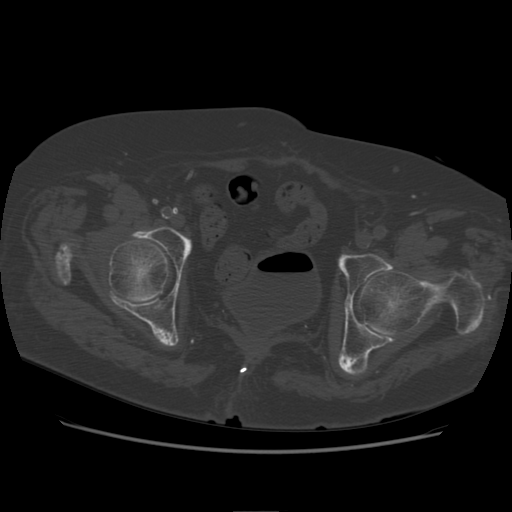
[im 62/93  soft-tissue]
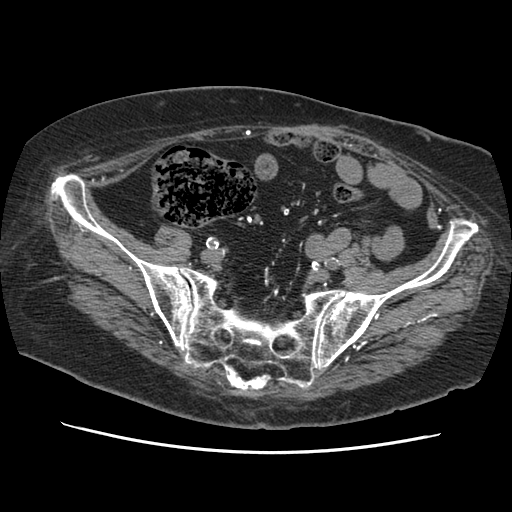
[im 62/93  lung]
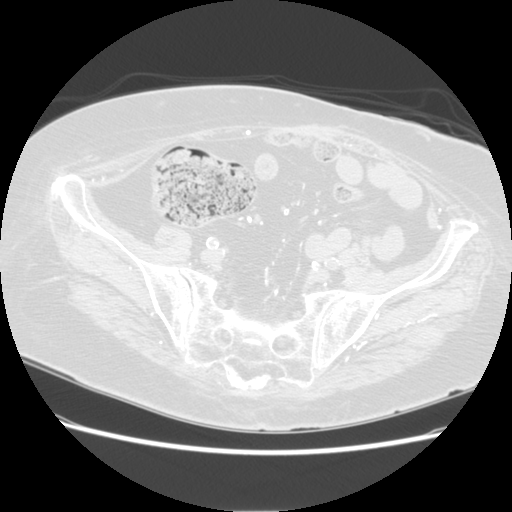

[Series 401: sag bone · sagittal · 0.67mm/px · 4 of 172 slices shown]
[im 25/172  bone]
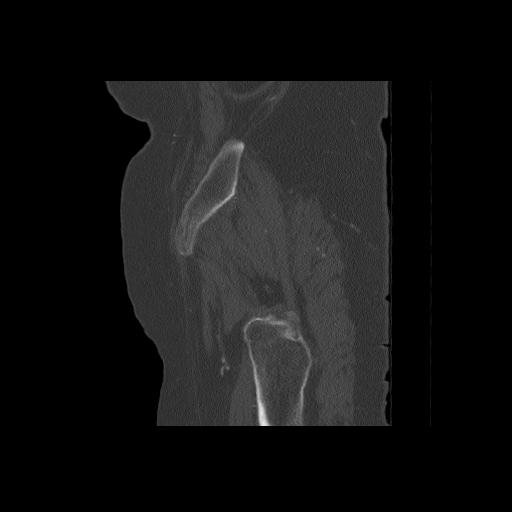
[im 49/172  bone]
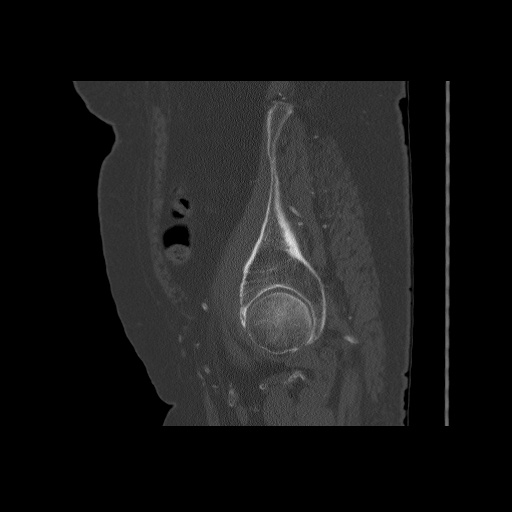
[im 74/172  bone]
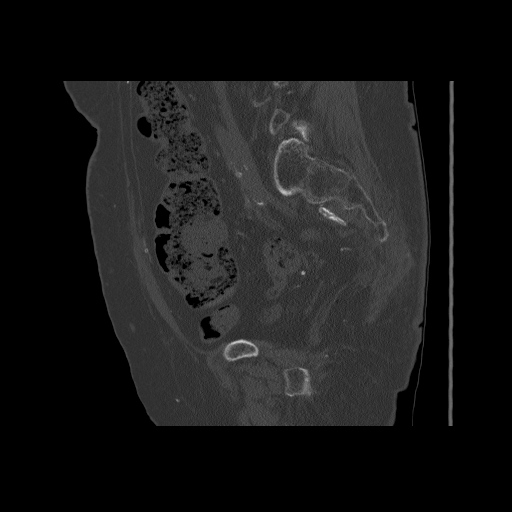
[im 98/172  bone]
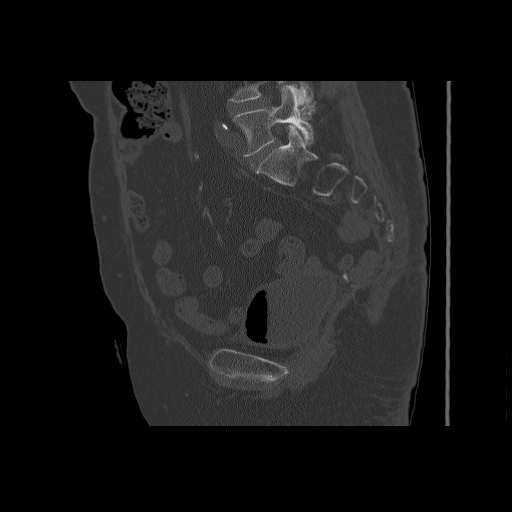

[Series 402: cor soft · coronal · 0.67mm/px · 4 of 132 slices shown]
[im 27/132  soft-tissue]
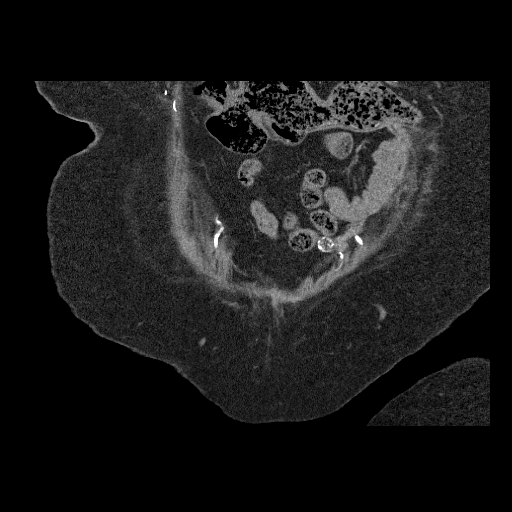
[im 53/132  soft-tissue]
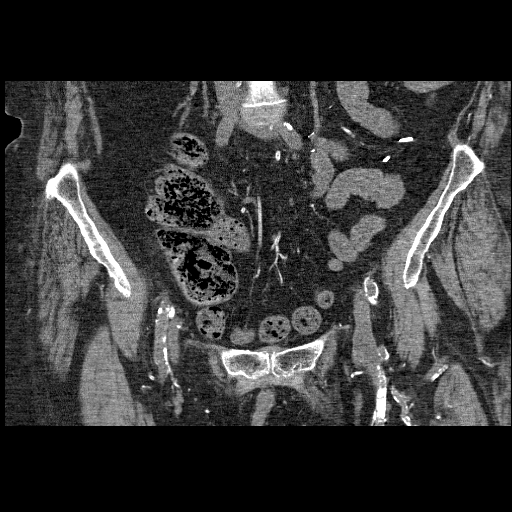
[im 79/132  soft-tissue]
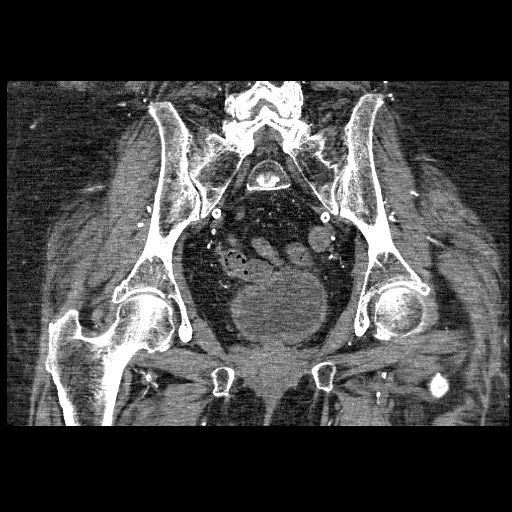
[im 105/132  soft-tissue]
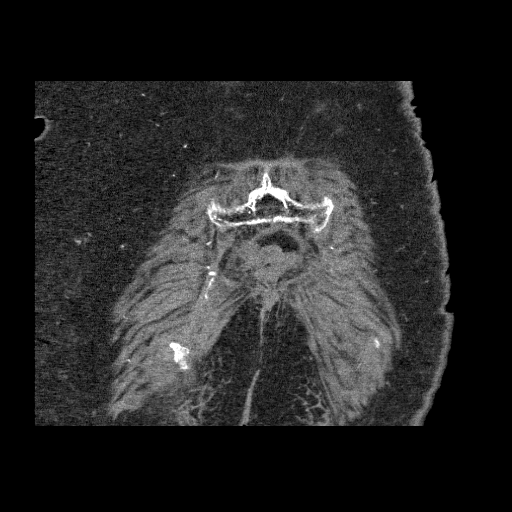

[Series 403: sag soft · sagittal · 0.67mm/px · 6 of 171 slices shown]
[im 25/171  soft-tissue]
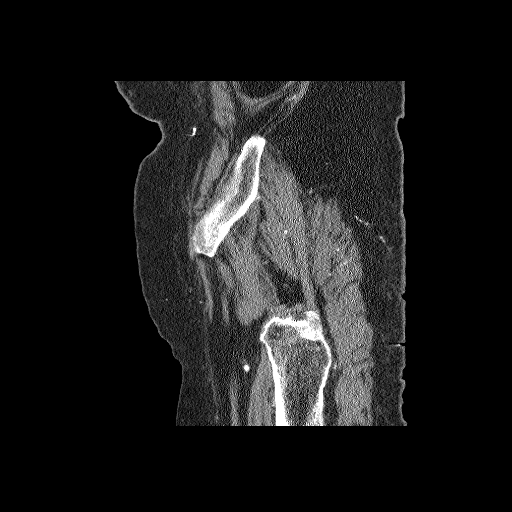
[im 49/171  soft-tissue]
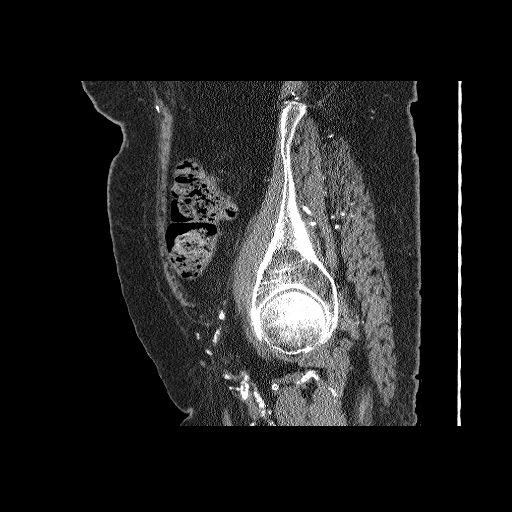
[im 73/171  soft-tissue]
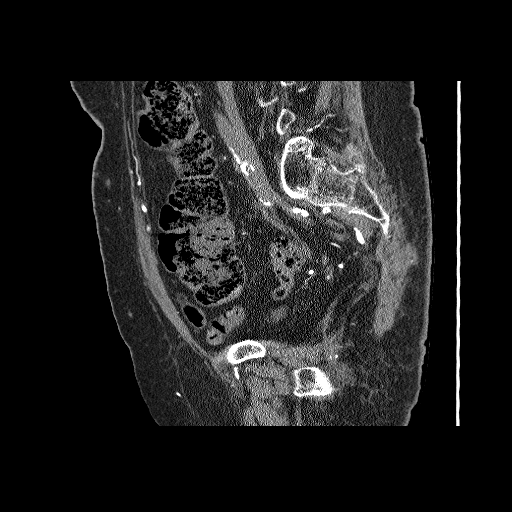
[im 98/171  soft-tissue]
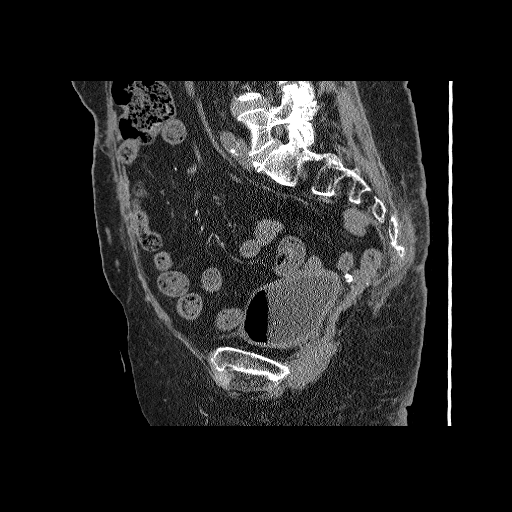
[im 122/171  soft-tissue]
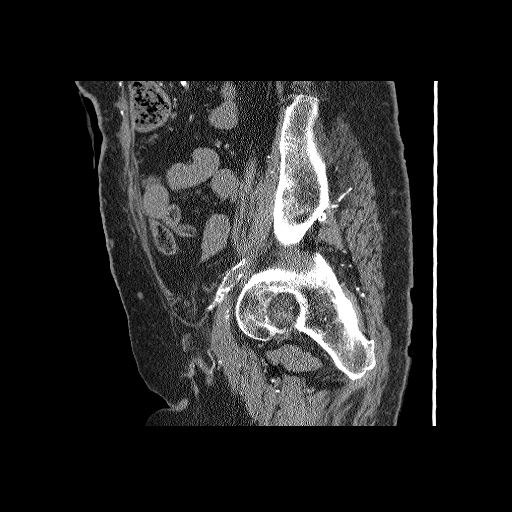
[im 146/171  soft-tissue]
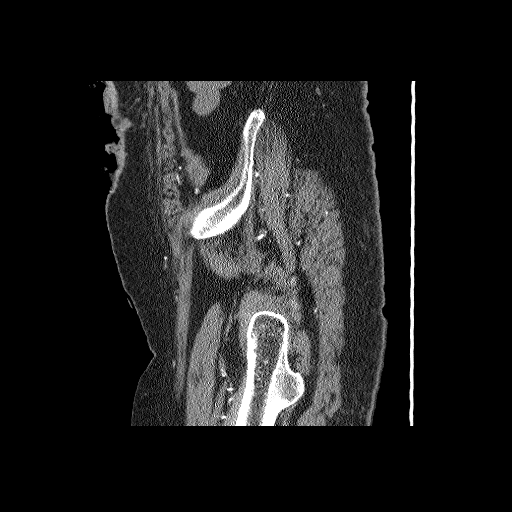

[16 of 32 positions shown; findings below may reference images not displayed]

FINDINGS: The sacrum and coccyx appear normal. There is dystrophic
calcification in soft tissues just to the right of the distal sacrum
and coccyx, possibly related to previous surgery or previous
infections or inflammation but there is no visible abscess or mass.

There is air in the bladder. Has the patient recently been
catheterized? If not, the possibility of bladder infection should be
considered.

There is a small amount of fluid in the soft tissues posterior to
the bladder which I presume is air in the vagina.

There are multiple surgical clips in the pelvis as well as extensive
calcification in the vessels of the pelvis.

No adenopathy or mass lesion. Left lower quadrant colostomy is
noted. Uterus and ovaries appear to have been removed.
IMPRESSION: 1. Normal appearing sacrum and coccyx.
2. Small areas of dystrophic calcification just to the right of the
distal sacrum and coccyx, possibly related to prior surgery or prior
infections.
3. Air in the bladder of unknown etiology. Has the patient recently
been catheterized?
4. Small amount of air in the soft tissues posterior to the bladder
which I presume is air in the vagina.
